# Patient Record
Sex: Male | Born: 1965 | Race: White | Hispanic: No | Marital: Married | Smoking: Current every day smoker
Health system: Southern US, Community
[De-identification: ages and names within clinical notes are randomized; demographics above are authoritative.]

## PROBLEM LIST (undated history)

## (undated) DIAGNOSIS — R161 Splenomegaly, not elsewhere classified: Secondary | ICD-10-CM

## (undated) DIAGNOSIS — I10 Essential (primary) hypertension: Secondary | ICD-10-CM

## (undated) DIAGNOSIS — M1990 Unspecified osteoarthritis, unspecified site: Secondary | ICD-10-CM

## (undated) DIAGNOSIS — Z87442 Personal history of urinary calculi: Secondary | ICD-10-CM

## (undated) DIAGNOSIS — K760 Fatty (change of) liver, not elsewhere classified: Secondary | ICD-10-CM

## (undated) DIAGNOSIS — R5383 Other fatigue: Secondary | ICD-10-CM

## (undated) DIAGNOSIS — F410 Panic disorder [episodic paroxysmal anxiety] without agoraphobia: Secondary | ICD-10-CM

## (undated) DIAGNOSIS — Z9889 Other specified postprocedural states: Secondary | ICD-10-CM

## (undated) DIAGNOSIS — G479 Sleep disorder, unspecified: Secondary | ICD-10-CM

## (undated) DIAGNOSIS — Z8639 Personal history of other endocrine, nutritional and metabolic disease: Secondary | ICD-10-CM

## (undated) DIAGNOSIS — K219 Gastro-esophageal reflux disease without esophagitis: Secondary | ICD-10-CM

## (undated) DIAGNOSIS — R112 Nausea with vomiting, unspecified: Secondary | ICD-10-CM

## (undated) HISTORY — DX: Essential (primary) hypertension: I10

## (undated) HISTORY — PX: DENTAL SURGERY: SHX609

## (undated) HISTORY — DX: Other fatigue: R53.83

## (undated) HISTORY — PX: KNEE ARTHROSCOPY: SUR90

## (undated) HISTORY — PX: CHOLECYSTECTOMY: SHX55

---

## 2007-08-13 ENCOUNTER — Encounter: Payer: Self-pay | Admitting: Cardiology

## 2008-07-07 ENCOUNTER — Encounter: Payer: Self-pay | Admitting: Cardiology

## 2008-09-07 ENCOUNTER — Encounter: Payer: Self-pay | Admitting: Cardiology

## 2008-09-07 ENCOUNTER — Ambulatory Visit: Payer: Self-pay | Admitting: Cardiology

## 2008-10-01 ENCOUNTER — Ambulatory Visit: Payer: Self-pay | Admitting: Cardiology

## 2008-11-03 DIAGNOSIS — R5383 Other fatigue: Secondary | ICD-10-CM

## 2008-11-03 DIAGNOSIS — R079 Chest pain, unspecified: Secondary | ICD-10-CM | POA: Insufficient documentation

## 2008-11-03 DIAGNOSIS — I10 Essential (primary) hypertension: Secondary | ICD-10-CM | POA: Insufficient documentation

## 2008-11-03 DIAGNOSIS — R5381 Other malaise: Secondary | ICD-10-CM | POA: Insufficient documentation

## 2009-04-04 HISTORY — PX: LITHOTRIPSY: SUR834

## 2010-08-17 NOTE — Assessment & Plan Note (Signed)
[facility]                          [facility] OFFICE NOTE   [name], [name]                    MRN:          [id number]  DATE:10/01/2008                            DOB:          02/03/1966    PRIMARY CARE PHYSICIAN:  Kirstie Peri, MD   REASON FOR PRESENTATION:  Evaluate the patient with recent  hospitalization with chest pain.   HISTORY OF PRESENT ILLNESS:  The patient was hospitalized in early June  with chest discomfort.  He had significant cardiovascular risk factors  that we ruled out for myocardial infarction.  He was discharged and had  an outpatient stress perfusion study.  He is able to exercise for 11  minutes, almost without chest discomfort.  His ejection fraction was  found to be 54%.  There was some artifact and some perhaps mild fixed  defect in the inferior wall, but overall was felt to be a low-risk scan.  He presents for followup.  Since going home, he has cut back on his  cigarettes, but he is still smoking half a pack a day.  He is active and  doing some exercising.  He has had some fleeting chest discomfort, but  nothing similar to that which prompted his hospitalization.  He has had  no classic substernal chest discomfort, neck, or arm discomfort.  He  does not have any palpitation, presyncope, or syncope.  He has had no  PND or orthopnea.  He feels very tired.  On further questioning, he says  he only sleeps a few hours at night.   Of note, the patient has been exercising and dieting prior to his  hospitalization and has lost 20 pounds!   PAST MEDICAL HISTORY:  Tobacco abuse, hypertension, cholecystectomy.   ALLERGIES:  Intolerance to CODEINE.   MEDICATIONS:  1. Norvasc 5 mg daily.  2. Atenolol HCT 50/25 daily.  3. Aspirin 81 mg daily.  4. Fish oil.   REVIEW OF SYSTEMS:  As stated in the HPI and otherwise negative for all  other systems.   PHYSICAL EXAMINATION:  GENERAL:  The patient is in no distress.  VITAL SIGNS:  Blood pressure 163/100, heart rate 54 and regular, weight  251 pounds.  HEENT:  Eyes are unremarkable, pupils equal, round and reactive to  light, fundi not visualized, oral mucosa unremarkable.  NECK:  No jugular venous distention at 45 degrees.  Carotid upstroke  brisk and symmetric.  No bruits, no thyromegaly.  LYMPHATICS:  No adenopathy.  LUNGS:  Clear to auscultation bilaterally.  BACK:  No costovertebral angle tenderness.  CHEST:  Unremarkable.  HEART:  PMI not displaced or sustained, S1 and S2 within normal limits,  no S3, no S4, no clicks, no rubs, no murmurs.  ABDOMEN:  Obese, positive bowel sounds.  Normal in frequency and pitch,  no bruits, no rebound, no guarding, no midline pulsatile mass, no  hepatosplenomegaly, no splenomegaly.  SKIN:  No rashes, no nodules.  EXTREMITIES:  Pulses are 2+, no edema.   ASSESSMENT AND PLAN:  1. Chest discomfort.  The patient's chest comfort was atypical and  there was no objective evidence of ischemia on the perfusion study.      There was a questionable defect in the inferior wall, but it was      very low-risk scan.  He and I discussed this result.  He did have      an echocardiogram demonstrating no wall motion abnormalities.      Given this, I do not think further cardiovascular testing is      suggested.  Certainly if he has any recurrent chest discomfort,      then we could consider cardiac catheterization.  Until then he      needs aggressive risk reduction.  2. Tobacco.  We discussed the need to stop smoking completely and      hopefully he will comply with this.  3. Fatigue and insomnia.  I did take the liberty of giving him Ambien      10 mg daily, but only a 26-month prescription.  I told him that if      he needs any renewals on this, he would need to get it from his      primary physician.  4. Hypertension.  His blood pressure is slightly elevated today.  He      has been started on the Norvasc and atenolol  for this.  This should      be followed by his primary care physician.  He needs continued      weight loss and salt restriction.  He will need his meds titrated      if this persists.  He understands the importance of this.  5. Followup.  The patient can be seen back in this clinic as needed,      try to get his blood pressure.      Rollene Rotunda, MD, [facility]  Electronically Signed    JH/MedQ  DD: 10/01/2008  DT: 10/02/2008  Job #: [id number]   cc:   Kirstie Peri, MD

## 2011-11-02 ENCOUNTER — Ambulatory Visit (HOSPITAL_COMMUNITY)
Admission: RE | Admit: 2011-11-02 | Discharge: 2011-11-02 | Disposition: A | Discharge status: Home / Self Care | Payer: Medicaid Other | Source: Ambulatory Visit | Attending: Orthopedic Surgery | Admitting: Orthopedic Surgery

## 2011-11-02 ENCOUNTER — Encounter (HOSPITAL_COMMUNITY)
Admission: RE | Admit: 2011-11-02 | Discharge: 2011-11-02 | Disposition: A | Discharge status: Home / Self Care | Payer: Medicaid Other | Source: Ambulatory Visit | Attending: Orthopedic Surgery | Admitting: Orthopedic Surgery

## 2011-11-02 ENCOUNTER — Encounter (HOSPITAL_COMMUNITY): Payer: Self-pay | Admitting: Pharmacy Technician

## 2011-11-02 ENCOUNTER — Encounter (HOSPITAL_COMMUNITY): Payer: Self-pay

## 2011-11-02 DIAGNOSIS — I498 Other specified cardiac arrhythmias: Secondary | ICD-10-CM | POA: Insufficient documentation

## 2011-11-02 DIAGNOSIS — Z01818 Encounter for other preprocedural examination: Secondary | ICD-10-CM | POA: Insufficient documentation

## 2011-11-02 DIAGNOSIS — Z01812 Encounter for preprocedural laboratory examination: Secondary | ICD-10-CM | POA: Insufficient documentation

## 2011-11-02 DIAGNOSIS — Z01810 Encounter for preprocedural cardiovascular examination: Secondary | ICD-10-CM | POA: Insufficient documentation

## 2011-11-02 DIAGNOSIS — I459 Conduction disorder, unspecified: Secondary | ICD-10-CM | POA: Insufficient documentation

## 2011-11-02 HISTORY — DX: Personal history of urinary calculi: Z87.442

## 2011-11-02 HISTORY — DX: Nausea with vomiting, unspecified: R11.2

## 2011-11-02 HISTORY — DX: Personal history of other endocrine, nutritional and metabolic disease: Z86.39

## 2011-11-02 HISTORY — DX: Sleep disorder, unspecified: G47.9

## 2011-11-02 HISTORY — DX: Gastro-esophageal reflux disease without esophagitis: K21.9

## 2011-11-02 HISTORY — DX: Panic disorder (episodic paroxysmal anxiety): F41.0

## 2011-11-02 HISTORY — DX: Unspecified osteoarthritis, unspecified site: M19.90

## 2011-11-02 HISTORY — DX: Other specified postprocedural states: Z98.890

## 2011-11-02 LAB — URINALYSIS, ROUTINE W REFLEX MICROSCOPIC
Ketones, ur: NEGATIVE mg/dL
Leukocytes, UA: NEGATIVE
Nitrite: NEGATIVE
Protein, ur: NEGATIVE mg/dL

## 2011-11-02 LAB — BASIC METABOLIC PANEL
CO2: 28 mEq/L (ref 19–32)
Calcium: 9.9 mg/dL (ref 8.4–10.5)
Chloride: 99 mEq/L (ref 96–112)
Creatinine, Ser: 0.82 mg/dL (ref 0.50–1.35)
GFR calc Af Amer: >90 mL/min (ref >90)
Sodium: 139 mEq/L (ref 135–145)

## 2011-11-02 LAB — CBC
HCT: 49.8 % (ref 39.0–52.0)
MCV: 81.5 fL (ref 78.0–100.0)
Platelets: 178 10*3/uL (ref 150–400)
RBC: 6.11 MIL/uL — ABNORMAL HIGH (ref 4.22–5.81)
RDW: 12.5 % (ref 11.5–15.5)
WBC: 7.4 10*3/uL (ref 4.0–10.5)

## 2011-11-02 LAB — SURGICAL PCR SCREEN
MRSA, PCR: NEGATIVE
Staphylococcus aureus: NEGATIVE

## 2011-11-02 LAB — APTT: aPTT: 32 seconds (ref 24–37)

## 2011-11-02 LAB — PROTIME-INR: INR: 0.94 (ref 0.00–1.49)

## 2011-11-02 MED ORDER — CHLORHEXIDINE GLUCONATE 4 % EX LIQD
60.0000 mL | Freq: Once | CUTANEOUS | Status: DC
  Filled 2011-11-02: qty 60

## 2011-11-02 NOTE — Patient Instructions (Addendum)
[address] [facility]  11/02/2011   Your procedure is scheduled on:  11/15/11  7:15 AM  Report to SHORT STAY DEPT  at 5:15 AM.  Call this number if you have problems the morning of surgery: [phone]   Remember:   Do not eat food or drink liquids AFTER MIDNIGHT    Take these medicines the morning of surgery with A SIP OF WATER: XANAX IF NEEDED / AMLODIPINE / LORATADINE / OMEPRAZOLE   Do not wear jewelry, make-up or nail polish.  Do not wear lotions, powders, or perfumes.   Do not shave legs or underarms 48 hrs. before surgery (men may shave face)  Do not bring valuables to the hospital.  Contacts, dentures or bridgework may not be worn into surgery.  Leave suitcase in the car. After surgery it may be brought to your room.  For patients admitted to the hospital, checkout time is 11:00 AM the day of discharge.   Patients discharged the day of surgery will not be allowed to drive home.    Special Instructions:   Please read over the following fact sheets that you were given: MRSA  Information / Incentive Spirometer               SHOWER WITH BETASEPT THE NIGHT BEFORE SURGERY AND THE MORNING OF SURGERY

## 2011-11-02 NOTE — Progress Notes (Signed)
H&P dictated 11/02/11 Dictation # [id number]

## 2011-11-03 NOTE — H&P (Signed)
[name], [name]               ACCOUNT NO.:  [id number]  MEDICAL RECORD NO.:  [id number]  LOCATION:  [facility]                         FACILITY:  [facility]  PHYSICIAN:  Jaquelyn Bitter. [employer]heon[employer] Wisby, P.A.DATE OF BIRTH:  Aug 05, 1965  DATE OF ADMISSION:  11/02/2011 DATE OF DISCHARGE:  11/02/2011                             HISTORY & PHYSICAL   DATE OF SURGERY:  November 15, 2011.  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  PROPOSED PROCEDURE:  Unicompartmental arthroplasty, medial compartment, left knee.  HISTORY OF PRESENT ILLNESS:  This is a 46 year old gentleman with history of previous arthroscopy, subtotal meniscectomy, who has developed medial compartment arthritis.  There has been resistant to conservative management.  After discussion of treatment, benefits, risks, and options, the patient is now scheduled for unicompartmental arthroplasty, medial compartment left knee.  Note that the patient is a candidate for trans-cinnamic acid and dexamethasone, will receive both at surgery.  His medical doctor is Dr. Clelia Croft.  He does plan on going home after surgery and is given his home medicines of aspirin, Robaxin, iron, MiraLax, and Colace.  The surgery of risks, benefits, and aftercare were discussed in detail with the patient.  Questions invited and answered.  PAST MEDICAL HISTORY:  Drug allergies to anything with CODEINE or CODEINE DERIVATIVES.  He is used Dilaudid before postoperatively with the good result and then switching to tramadol in the early postoperative period.  CURRENT MEDICATIONS:  Omeprazole 40 mg daily; amlodipine 5 mg daily; atenolol/chlorthalidone 50/25 mg one daily; lisinopril 20 mg daily; Ambien 10 mg daily; alprazolam 0.5 mg daily; and potassium, which he has just recently started on, he does not have the dosage.  SERIOUS MEDICAL ILLNESSES:  Include reflux, anxiety, hypertension.  Also history of kidney stones.  PREVIOUS SURGERIES:  Include  knee arthroscopy by Dr. Cleophas Dunker and by Dr. Charlann Boxer, cholecystectomy and lithotripsy for kidney stones.  FAMILY HISTORY:  Positive for coronary artery disease, adenocarcinoma, and MI.  SOCIAL HISTORY:  The patient is married.  He works as a Financial risk analyst.  He smokes one pack per day and does not drink.  Again, he is planning on going home after surgery.  REVIEW OF SYSTEMS:  CENTRAL NERVOUS SYSTEM:  Positive for anxiety and depression.  PULMONARY:  Negative for shortness of breath, PND, orthopnea.  CARDIOVASCULAR:  Negative chest pain, palpitation.  GI: Positive for heartburn and reflux.  GU:  Negative for urinary tract difficulty.  MUSCULOSKELETAL:  Positive as in HPI.  PHYSICAL EXAMINATION:  VITAL SIGNS:  BP 148/98, pulse 72 and regular, respirations 14. HEENT:  Head normocephalic.  Nose patent.  Ears patent.  Pupils equal, round, reactive to light.  Throat without injection. NECK:  Supple without adenopathy.  Carotids 2+ without bruit. CHEST:  Clear to auscultation.  No rales or rhonchi.  Respirations 14. HEART:  Regular rate and rhythm at 72 beats per minute without murmur. ABDOMEN:  Soft.  Active bowel sounds.  No masses or organomegaly. NEUROLOGIC:  The patient alert and oriented to time, place, and person. Cranial nerves II through XII grossly intact. EXTREMITIES:  Shows the left knee with tenderness in the medial joint line.  Slight varus deformity, full extension, further  flexion to 125 degrees.  Sensation and circulation are intact.  IMPRESSION:  Medial compartment osteoarthritis, left knee.  PLAN:  Unicompartmental arthroplasty, left knee.     Jaquelyn Bitter. Ernestene Kiel.     SJC/MEDQ  D:  11/02/2011  T:  11/03/2011  Job:  [id number]

## 2011-11-04 NOTE — Pre-Procedure Instructions (Signed)
Spoke with [employer] PA concerning Potassium level of 2.9 - he will take care of it

## 2011-11-15 ENCOUNTER — Ambulatory Visit (HOSPITAL_COMMUNITY): Payer: Worker's Compensation | Admitting: Anesthesiology

## 2011-11-15 ENCOUNTER — Encounter (HOSPITAL_COMMUNITY): Payer: Self-pay

## 2011-11-15 ENCOUNTER — Encounter (HOSPITAL_COMMUNITY)
Admission: RE | Disposition: A | Discharge status: Home Health | Payer: Self-pay | Source: Ambulatory Visit | Attending: Orthopedic Surgery

## 2011-11-15 ENCOUNTER — Inpatient Hospital Stay (HOSPITAL_COMMUNITY)
Admission: RE | Admit: 2011-11-15 | Discharge: 2011-11-16 | DRG: 470 | Disposition: A | Discharge status: Home Health | Payer: Worker's Compensation | Source: Ambulatory Visit | Attending: Orthopedic Surgery | Admitting: Orthopedic Surgery

## 2011-11-15 ENCOUNTER — Encounter (HOSPITAL_COMMUNITY): Payer: Self-pay | Admitting: Anesthesiology

## 2011-11-15 DIAGNOSIS — E669 Obesity, unspecified: Secondary | ICD-10-CM | POA: Diagnosis present

## 2011-11-15 DIAGNOSIS — F411 Generalized anxiety disorder: Secondary | ICD-10-CM | POA: Diagnosis present

## 2011-11-15 DIAGNOSIS — K219 Gastro-esophageal reflux disease without esophagitis: Secondary | ICD-10-CM | POA: Diagnosis present

## 2011-11-15 DIAGNOSIS — Z96652 Presence of left artificial knee joint: Secondary | ICD-10-CM

## 2011-11-15 DIAGNOSIS — M1710 Unilateral primary osteoarthritis, unspecified knee: Principal | ICD-10-CM | POA: Diagnosis present

## 2011-11-15 DIAGNOSIS — I10 Essential (primary) hypertension: Secondary | ICD-10-CM | POA: Diagnosis present

## 2011-11-15 HISTORY — PX: PARTIAL KNEE ARTHROPLASTY: SHX2174

## 2011-11-15 LAB — TYPE AND SCREEN: ABO/RH(D): O NEG

## 2011-11-15 SURGERY — ARTHROPLASTY, KNEE, UNICOMPARTMENTAL
Anesthesia: Spinal | Site: Knee | Laterality: Left | Wound class: Clean

## 2011-11-15 MED ORDER — METHOCARBAMOL 100 MG/ML IJ SOLN
500.0000 mg | Freq: Four times a day (QID) | INTRAVENOUS | Status: DC | PRN
  Filled 2011-11-15: qty 5

## 2011-11-15 MED ORDER — AMLODIPINE BESYLATE 5 MG PO TABS
5.0000 mg | ORAL_TABLET | Freq: Every morning | ORAL | Status: DC
  Administered 2011-11-16: 5 mg via ORAL
  Filled 2011-11-15: qty 1

## 2011-11-15 MED ORDER — MENTHOL 3 MG MT LOZG
1.0000 | LOZENGE | OROMUCOSAL | Status: DC | PRN
  Filled 2011-11-15: qty 9

## 2011-11-15 MED ORDER — FENTANYL CITRATE 0.05 MG/ML IJ SOLN
INTRAMUSCULAR | Status: DC | PRN
  Administered 2011-11-15: 100 ug via INTRAVENOUS

## 2011-11-15 MED ORDER — HYDROMORPHONE HCL PF 1 MG/ML IJ SOLN: 0.2500 mg | INTRAMUSCULAR | Status: DC | PRN

## 2011-11-15 MED ORDER — ALPRAZOLAM 0.5 MG PO TABS: 0.5000 mg | ORAL_TABLET | Freq: Three times a day (TID) | ORAL | Status: DC | PRN

## 2011-11-15 MED ORDER — LORATADINE 10 MG PO TABS
10.0000 mg | ORAL_TABLET | Freq: Every day | ORAL | Status: DC | PRN
  Filled 2011-11-15: qty 1

## 2011-11-15 MED ORDER — ZOLPIDEM TARTRATE 5 MG PO TABS: 5.0000 mg | ORAL_TABLET | Freq: Every evening | ORAL | Status: DC | PRN

## 2011-11-15 MED ORDER — DOCUSATE SODIUM 100 MG PO CAPS
100.0000 mg | ORAL_CAPSULE | Freq: Two times a day (BID) | ORAL | Status: DC
  Administered 2011-11-15 – 2011-11-16 (×2): 100 mg via ORAL

## 2011-11-15 MED ORDER — ONDANSETRON HCL 4 MG/2ML IJ SOLN
4.0000 mg | Freq: Four times a day (QID) | INTRAMUSCULAR | Status: DC | PRN
  Administered 2011-11-16: 4 mg via INTRAVENOUS
  Filled 2011-11-15: qty 2

## 2011-11-15 MED ORDER — HYDROMORPHONE HCL 2 MG PO TABS
2.0000 mg | ORAL_TABLET | ORAL | Status: DC | PRN
  Administered 2011-11-15 (×4): 2 mg via ORAL
  Administered 2011-11-15 – 2011-11-16 (×3): 4 mg via ORAL
  Administered 2011-11-16: 2 mg via ORAL
  Filled 2011-11-15: qty 1
  Filled 2011-11-15: qty 2
  Filled 2011-11-15 (×2): qty 1
  Filled 2011-11-15 (×2): qty 2
  Filled 2011-11-15: qty 1
  Filled 2011-11-15: qty 2

## 2011-11-15 MED ORDER — LACTATED RINGERS IV SOLN
INTRAVENOUS | Status: DC | PRN
  Administered 2011-11-15 (×3): via INTRAVENOUS

## 2011-11-15 MED ORDER — KETOROLAC TROMETHAMINE 30 MG/ML IJ SOLN
INTRAMUSCULAR | Status: AC
  Filled 2011-11-15: qty 1

## 2011-11-15 MED ORDER — ACETAMINOPHEN 10 MG/ML IV SOLN
1000.0000 mg | Freq: Four times a day (QID) | INTRAVENOUS | Status: DC
  Administered 2011-11-15 – 2011-11-16 (×3): 1000 mg via INTRAVENOUS
  Filled 2011-11-15 (×5): qty 100

## 2011-11-15 MED ORDER — MEPERIDINE HCL 50 MG/ML IJ SOLN: 6.2500 mg | INTRAMUSCULAR | Status: DC | PRN

## 2011-11-15 MED ORDER — ACETAMINOPHEN 10 MG/ML IV SOLN: 1000.0000 mg | Freq: Once | INTRAVENOUS | Status: DC | PRN

## 2011-11-15 MED ORDER — BUPIVACAINE IN DEXTROSE 0.75-8.25 % IT SOLN
INTRATHECAL | Status: DC | PRN
  Administered 2011-11-15: 15 mg via INTRATHECAL

## 2011-11-15 MED ORDER — SENNA 8.6 MG PO TABS
1.0000 | ORAL_TABLET | Freq: Two times a day (BID) | ORAL | Status: DC
Start: 2011-11-15 — End: 2011-11-16
  Administered 2011-11-15 – 2011-11-16 (×2): 8.6 mg via ORAL
  Filled 2011-11-15 (×2): qty 1

## 2011-11-15 MED ORDER — BUPIVACAINE-EPINEPHRINE PF 0.25-1:200000 % IJ SOLN
INTRAMUSCULAR | Status: AC
  Filled 2011-11-15: qty 30

## 2011-11-15 MED ORDER — PHENOL 1.4 % MT LIQD
1.0000 | OROMUCOSAL | Status: DC | PRN
  Filled 2011-11-15: qty 177

## 2011-11-15 MED ORDER — PROPOFOL 10 MG/ML IV EMUL
INTRAVENOUS | Status: DC | PRN
  Administered 2011-11-15: 80 ug/kg/min via INTRAVENOUS

## 2011-11-15 MED ORDER — CEFAZOLIN SODIUM-DEXTROSE 2-3 GM-% IV SOLR
INTRAVENOUS | Status: AC
  Filled 2011-11-15: qty 50

## 2011-11-15 MED ORDER — ALUM & MAG HYDROXIDE-SIMETH 200-200-20 MG/5ML PO SUSP: 30.0000 mL | ORAL | Status: DC | PRN

## 2011-11-15 MED ORDER — ATENOLOL-CHLORTHALIDONE 50-25 MG PO TABS: 1.0000 | ORAL_TABLET | Freq: Every morning | ORAL | Status: DC

## 2011-11-15 MED ORDER — PROMETHAZINE HCL 25 MG/ML IJ SOLN: 6.2500 mg | INTRAMUSCULAR | Status: DC | PRN

## 2011-11-15 MED ORDER — LISINOPRIL 20 MG PO TABS
20.0000 mg | ORAL_TABLET | Freq: Every morning | ORAL | Status: DC
  Administered 2011-11-15 – 2011-11-16 (×2): 20 mg via ORAL
  Filled 2011-11-15 (×2): qty 1

## 2011-11-15 MED ORDER — DEXAMETHASONE SODIUM PHOSPHATE 10 MG/ML IJ SOLN: 10.0000 mg | Freq: Once | INTRAMUSCULAR | Status: DC

## 2011-11-15 MED ORDER — ONDANSETRON HCL 4 MG PO TABS: 4.0000 mg | ORAL_TABLET | Freq: Four times a day (QID) | ORAL | Status: DC | PRN

## 2011-11-15 MED ORDER — FERROUS SULFATE 325 (65 FE) MG PO TABS
325.0000 mg | ORAL_TABLET | Freq: Three times a day (TID) | ORAL | Status: DC
  Administered 2011-11-15 – 2011-11-16 (×2): 325 mg via ORAL
  Filled 2011-11-15 (×5): qty 1

## 2011-11-15 MED ORDER — HYDROMORPHONE HCL PF 1 MG/ML IJ SOLN
0.2500 mg | INTRAMUSCULAR | Status: DC | PRN
  Administered 2011-11-15 (×4): 0.5 mg via INTRAVENOUS

## 2011-11-15 MED ORDER — TRANEXAMIC ACID 100 MG/ML IV SOLN
15.0000 mg/kg | Freq: Once | INTRAVENOUS | Status: AC
  Administered 2011-11-15: 1776 mg via INTRAVENOUS
  Filled 2011-11-15: qty 17.76

## 2011-11-15 MED ORDER — METHOCARBAMOL 500 MG PO TABS
500.0000 mg | ORAL_TABLET | Freq: Four times a day (QID) | ORAL | Status: DC | PRN
  Administered 2011-11-15 – 2011-11-16 (×2): 500 mg via ORAL
  Filled 2011-11-15 (×2): qty 1

## 2011-11-15 MED ORDER — PANTOPRAZOLE SODIUM 40 MG PO TBEC
80.0000 mg | DELAYED_RELEASE_TABLET | Freq: Every day | ORAL | Status: DC
  Filled 2011-11-15: qty 2

## 2011-11-15 MED ORDER — CHLORTHALIDONE 25 MG PO TABS
25.0000 mg | ORAL_TABLET | Freq: Every day | ORAL | Status: DC
  Administered 2011-11-16: 25 mg via ORAL
  Filled 2011-11-15: qty 1

## 2011-11-15 MED ORDER — CEFAZOLIN SODIUM-DEXTROSE 2-3 GM-% IV SOLR
2.0000 g | INTRAVENOUS | Status: AC
  Administered 2011-11-15: 2 g via INTRAVENOUS

## 2011-11-15 MED ORDER — ACETAMINOPHEN 10 MG/ML IV SOLN
INTRAVENOUS | Status: AC
  Filled 2011-11-15: qty 100

## 2011-11-15 MED ORDER — RIVAROXABAN 10 MG PO TABS
10.0000 mg | ORAL_TABLET | Freq: Every day | ORAL | Status: DC
  Administered 2011-11-16: 10 mg via ORAL
  Filled 2011-11-15 (×2): qty 1

## 2011-11-15 MED ORDER — HYDROMORPHONE HCL PF 1 MG/ML IJ SOLN
INTRAMUSCULAR | Status: AC
  Filled 2011-11-15: qty 1

## 2011-11-15 MED ORDER — ATENOLOL 50 MG PO TABS
50.0000 mg | ORAL_TABLET | Freq: Once | ORAL | Status: AC
  Administered 2011-11-15: 50 mg via ORAL
  Filled 2011-11-15: qty 1

## 2011-11-15 MED ORDER — MIDAZOLAM HCL 5 MG/5ML IJ SOLN
INTRAMUSCULAR | Status: DC | PRN
  Administered 2011-11-15: 2 mg via INTRAVENOUS

## 2011-11-15 MED ORDER — DIPHENHYDRAMINE HCL 12.5 MG/5ML PO ELIX: 25.0000 mg | ORAL_SOLUTION | Freq: Four times a day (QID) | ORAL | Status: DC | PRN

## 2011-11-15 MED ORDER — ACETAMINOPHEN 10 MG/ML IV SOLN
INTRAVENOUS | Status: DC | PRN
  Administered 2011-11-15: 1000 mg via INTRAVENOUS

## 2011-11-15 MED ORDER — 0.9 % SODIUM CHLORIDE (POUR BTL) OPTIME
TOPICAL | Status: DC | PRN
  Administered 2011-11-15: 1000 mL

## 2011-11-15 MED ORDER — KETOROLAC TROMETHAMINE 30 MG/ML IJ SOLN
INTRAMUSCULAR | Status: DC | PRN
  Administered 2011-11-15: 30 mg via INTRAVENOUS

## 2011-11-15 MED ORDER — SODIUM CHLORIDE 0.9 % IV SOLN
INTRAVENOUS | Status: DC
  Administered 2011-11-15: 19:00:00 via INTRAVENOUS
  Filled 2011-11-15 (×3): qty 1000

## 2011-11-15 MED ORDER — ATENOLOL 50 MG PO TABS
50.0000 mg | ORAL_TABLET | Freq: Every day | ORAL | Status: DC
  Administered 2011-11-16: 50 mg via ORAL
  Filled 2011-11-15: qty 1

## 2011-11-15 MED ORDER — BUPIVACAINE-EPINEPHRINE 0.25% -1:200000 IJ SOLN
INTRAMUSCULAR | Status: DC | PRN
  Administered 2011-11-15: 30 mL

## 2011-11-15 MED ORDER — POLYETHYLENE GLYCOL 3350 17 G PO PACK: 17.0000 g | PACK | Freq: Every day | ORAL | Status: DC | PRN

## 2011-11-15 MED ORDER — CEFAZOLIN SODIUM-DEXTROSE 2-3 GM-% IV SOLR
2.0000 g | Freq: Four times a day (QID) | INTRAVENOUS | Status: AC
  Administered 2011-11-15 (×2): 2 g via INTRAVENOUS
  Filled 2011-11-15 (×2): qty 50

## 2011-11-15 MED ORDER — POTASSIUM CHLORIDE CRYS ER 20 MEQ PO TBCR
20.0000 meq | EXTENDED_RELEASE_TABLET | Freq: Every morning | ORAL | Status: DC
  Administered 2011-11-15 – 2011-11-16 (×2): 20 meq via ORAL
  Filled 2011-11-15 (×2): qty 1

## 2011-11-15 SURGICAL SUPPLY — 49 items
BAG ZIPLOCK 12X15 (MISCELLANEOUS) ×2 IMPLANT
BANDAGE ELASTIC 6 VELCRO ST LF (GAUZE/BANDAGES/DRESSINGS) ×2 IMPLANT
BANDAGE ESMARK 6X9 LF (GAUZE/BANDAGES/DRESSINGS) ×1 IMPLANT
BLADE SAW RECIPROCATING 77.5 (BLADE) ×2 IMPLANT
BLADE SAW SGTL 13.0X1.19X90.0M (BLADE) ×2 IMPLANT
BNDG ESMARK 6X9 LF (GAUZE/BANDAGES/DRESSINGS) ×2
BOWL SMART MIX CTS (DISPOSABLE) ×2 IMPLANT
CEMENT HV SMART SET (Cement) ×2 IMPLANT
CLOTH BEACON ORANGE TIMEOUT ST (SAFETY) ×2 IMPLANT
COVER SURGICAL LIGHT HANDLE (MISCELLANEOUS) ×2 IMPLANT
CUFF TOURN SGL QUICK 34 (TOURNIQUET CUFF) ×1
CUFF TRNQT CYL 34X4X40X1 (TOURNIQUET CUFF) ×1 IMPLANT
DERMABOND ADVANCED (GAUZE/BANDAGES/DRESSINGS) ×1
DERMABOND ADVANCED .7 DNX12 (GAUZE/BANDAGES/DRESSINGS) ×1 IMPLANT
DRAPE EXTREMITY T 121X128X90 (DRAPE) ×2 IMPLANT
DRAPE POUCH INSTRU U-SHP 10X18 (DRAPES) ×2 IMPLANT
DRSG AQUACEL AG ADV 3.5X 6 (GAUZE/BANDAGES/DRESSINGS) ×2 IMPLANT
DRSG TEGADERM 4X4.75 (GAUZE/BANDAGES/DRESSINGS) ×2 IMPLANT
DURAPREP 26ML APPLICATOR (WOUND CARE) ×2 IMPLANT
ELECT REM PT RETURN 9FT ADLT (ELECTROSURGICAL) ×2
ELECTRODE REM PT RTRN 9FT ADLT (ELECTROSURGICAL) ×1 IMPLANT
EVACUATOR 1/8 PVC DRAIN (DRAIN) ×2 IMPLANT
FACESHIELD LNG OPTICON STERILE (SAFETY) ×8 IMPLANT
GAUZE SPONGE 2X2 8PLY STRL LF (GAUZE/BANDAGES/DRESSINGS) ×1 IMPLANT
GLOVE BIOGEL PI IND STRL 7.5 (GLOVE) ×1 IMPLANT
GLOVE BIOGEL PI IND STRL 8 (GLOVE) IMPLANT
GLOVE BIOGEL PI INDICATOR 7.5 (GLOVE) ×1
GLOVE BIOGEL PI INDICATOR 8 (GLOVE)
GLOVE ORTHO TXT STRL SZ7.5 (GLOVE) ×4 IMPLANT
GOWN BRE IMP PREV XXLGXLNG (GOWN DISPOSABLE) ×4 IMPLANT
GOWN STRL NON-REIN LRG LVL3 (GOWN DISPOSABLE) ×2 IMPLANT
KIT BASIN OR (CUSTOM PROCEDURE TRAY) ×2 IMPLANT
LEGGING LITHOTOMY PAIR STRL (DRAPES) ×2 IMPLANT
MANIFOLD NEPTUNE II (INSTRUMENTS) ×2 IMPLANT
NDL SAFETY ECLIPSE 18X1.5 (NEEDLE) ×1 IMPLANT
NEEDLE HYPO 18GX1.5 SHARP (NEEDLE) ×1
PACK TOTAL JOINT (CUSTOM PROCEDURE TRAY) ×2 IMPLANT
POSITIONER SURGICAL ARM (MISCELLANEOUS) ×2 IMPLANT
SPONGE GAUZE 2X2 STER 10/PKG (GAUZE/BANDAGES/DRESSINGS) ×1
SUCTION FRAZIER TIP 10 FR DISP (SUCTIONS) ×2 IMPLANT
SUT MNCRL AB 4-0 PS2 18 (SUTURE) ×2 IMPLANT
SUT VIC AB 1 CT1 36 (SUTURE) ×2 IMPLANT
SUT VIC AB 2-0 CT1 27 (SUTURE) ×2
SUT VIC AB 2-0 CT1 TAPERPNT 27 (SUTURE) ×2 IMPLANT
SUT VLOC 180 0 24IN GS25 (SUTURE) ×2 IMPLANT
SYR 50ML LL SCALE MARK (SYRINGE) ×2 IMPLANT
TOWEL OR 17X26 10 PK STRL BLUE (TOWEL DISPOSABLE) ×4 IMPLANT
TRAY FOLEY CATH 14FRSI W/METER (CATHETERS) ×2 IMPLANT
meniscal bearing ×2 IMPLANT

## 2011-11-15 NOTE — Transfer of Care (Signed)
Immediate Anesthesia Transfer of Care Note  Patient: [name]  Procedure(s) Performed: Procedure(s) (LRB): UNICOMPARTMENTAL KNEE (Left)  Patient Location: PACU  Anesthesia Type: Regional  Level of Consciousness: awake, alert  and oriented  Airway & Oxygen Therapy: Patient Spontanous Breathing and Patient connected to face mask oxygen  Post-op Assessment: Report given to PACU RN and Post -op Vital signs reviewed and stable  Post vital signs: Reviewed and stable  Complications: No apparent anesthesia complications

## 2011-11-15 NOTE — Progress Notes (Signed)
[name] called patient and he "took my klorcon as told too"

## 2011-11-15 NOTE — Anesthesia Preprocedure Evaluation (Signed)
Anesthesia Evaluation  Patient identified by MRN, date of birth, ID band Patient awake    Reviewed: Allergy & Precautions, H&P , NPO status , Patient's Chart, lab work & pertinent test results, reviewed documented beta blocker date and time   History of Anesthesia Complications (+) PONV  Airway Mallampati: II TM Distance: >3 FB Neck ROM: Full    Dental  (+) Dental Advisory Given and Teeth Intact   Pulmonary  breath sounds clear to auscultation  Pulmonary exam normal       Cardiovascular Exercise Tolerance: Good hypertension, Pt. on medications and Pt. on home beta blockers Rhythm:Regular Rate:Normal     Neuro/Psych    GI/Hepatic Neg liver ROS, GERD-  Medicated,  Endo/Other  negative endocrine ROS  Renal/GU negative Renal ROS     Musculoskeletal  (+) Arthritis -, Osteoarthritis,    Abdominal (+) + obese,   Peds  Hematology   Anesthesia Other Findings   Reproductive/Obstetrics                           Anesthesia Physical Anesthesia Plan  ASA: II  Anesthesia Plan: Spinal   Post-op Pain Management:    Induction: Intravenous  Airway Management Planned:   Additional Equipment:   Intra-op Plan:   Post-operative Plan:   Informed Consent: I have reviewed the patients History and Physical, chart, labs and discussed the procedure including the risks, benefits and alternatives for the proposed anesthesia with the patient or authorized representative who has indicated his/her understanding and acceptance.   Dental advisory given  Plan Discussed with: CRNA and Surgeon  Anesthesia Plan Comments:         Anesthesia Quick Evaluation

## 2011-11-15 NOTE — Anesthesia Postprocedure Evaluation (Signed)
Anesthesia Post Note  Patient: [name]  Procedure(s) Performed: Procedure(s) (LRB): UNICOMPARTMENTAL KNEE (Left)  Anesthesia type: Spinal  Patient location: PACU  Post pain: Pain level controlled  Post assessment: Post-op Vital signs reviewed  Last Vitals: BP 118/78  Pulse 56  Temp 36.3 C (Oral)  Resp 16  Ht 6\' 1"  (1.854 m)  Wt 261 lb (118.389 kg)  BMI 34.43 kg/m2  SpO2 97%  Post vital signs: Reviewed  Level of consciousness: sedated  Complications: No apparent anesthesia complications

## 2011-11-15 NOTE — Evaluation (Signed)
Physical Therapy Evaluation Patient Details Name: [name] MRN: [id number] DOB: 1965-05-01 Today's Date: 11/15/2011 Time: 4098-1191 PT Time Calculation (min): 37 min  PT Assessment / Plan / Recommendation Clinical Impression  46 yo male s/p L unicompartmental knee arthroplasty. POD 0 on PT eval. Mobilizing well. Reports min-mod pain with activity. Possible d/c home tomorrow per pt/wife. Will assess ambulation/stairs with crutches on tomorrow. Recommend HHPT.    PT Assessment  Patient needs continued PT services    Follow Up Recommendations  Home health PT    Barriers to Discharge        Equipment Recommendations   (To be determined-will assess for crutches)    Recommendations for Other Services OT consult   Frequency 7X/week    Precautions / Restrictions Precautions Precautions: Knee Required Braces or Orthoses: Knee Immobilizer - Left Knee Immobilizer - Left: On when out of bed or walking Restrictions Weight Bearing Restrictions: No LLE Weight Bearing: Weight bearing as tolerated   Pertinent Vitals/Pain 6/10 L knee with activity      Mobility  Bed Mobility Bed Mobility: Supine to Sit Supine to Sit: 4: Min guard;HOB elevated Transfers Transfers: Sit to Stand;Stand to Sit Sit to Stand: 4: Min guard;With upper extremity assist;From bed;From elevated surface Stand to Sit: 4: Min guard;With upper extremity assist;With armrests;To chair/3-in-1 Details for Transfer Assistance: VCs safety, technique, hand placement. Assist to rise, stabilize, control descent.  Ambulation/Gait Ambulation/Gait Assistance: 4: Min guard Ambulation Distance (Feet): 120 Feet Assistive device: Rolling walker Ambulation/Gait Assistance Details: VCs safety, technique, sequence, posture.  Gait Pattern: Step-to pattern;Antalgic;Decreased stride length    Exercises Total Joint Exercises Ankle Circles/Pumps: AROM;Both;10 reps;Supine Quad Sets: AROM;Both;10 reps;Supine Straight Leg Raises:  AROM;Left;10 reps;Supine Long Arc Quad: AROM;Left;10 reps;Supine Knee Flexion: AAROM;Left;Seated (7 reps) Goniometric ROM: 5-95 degrees   PT Diagnosis: Difficulty walking;Abnormality of gait;Acute pain  PT Problem List: Decreased strength;Decreased range of motion;Decreased mobility;Pain;Decreased knowledge of precautions;Decreased knowledge of use of DME PT Treatment Interventions: DME instruction;Gait training;Stair training;Functional mobility training;Therapeutic activities;Therapeutic exercise;Patient/family education   PT Goals Acute Rehab PT Goals PT Goal Formulation: With patient Time For Goal Achievement: 11/22/11 Potential to Achieve Goals: Good Pt will go Supine/Side to Sit: with supervision PT Goal: Supine/Side to Sit - Progress: Goal set today Pt will go Sit to Supine/Side: with supervision PT Goal: Sit to Supine/Side - Progress: Goal set today Pt will go Sit to Stand: with supervision PT Goal: Sit to Stand - Progress: Goal set today Pt will Ambulate: >150 feet;with supervision;with least restrictive assistive device PT Goal: Ambulate - Progress: Goal set today Pt will Go Up / Down Stairs: 3-5 stairs;with supervision;with least restrictive assistive device (5 steps) PT Goal: Up/Down Stairs - Progress: Goal set today  Visit Information  Last PT Received On: 11/15/11 Assistance Needed: +1    Subjective Data  Subjective: "This has been hurting for 2 years" Patient Stated Goal: Less pain. Independence   Prior Functioning  Home Living Lives With: Spouse Available Help at Discharge: Family Type of Home: House Home Access: Stairs to enter Secretary/administrator of Steps: 5 Entrance Stairs-Rails: None Home Layout: One level Home Adaptive Equipment: None Prior Function Level of Independence: Independent Able to Take Stairs?: Yes Driving: Yes Communication Communication: No difficulties    Cognition  Overall Cognitive Status: Appears within functional limits for  tasks assessed/performed Arousal/Alertness: Awake/alert Orientation Level: Appears intact for tasks assessed Behavior During Session: [facility] for tasks performed    Extremity/Trunk Assessment Right Lower Extremity Assessment RLE ROM/Strength/Tone: [facility] for  tasks assessed Left Lower Extremity Assessment LLE ROM/Strength/Tone: Deficits LLE ROM/Strength/Tone Deficits: SLR 3+/5, moves ankle well. Good quad set LLE Sensation: WFL - Light Touch Trunk Assessment Trunk Assessment: Normal   Balance    End of Session PT - End of Session Equipment Utilized During Treatment: Gait belt;Left knee immobilizer Activity Tolerance: Patient tolerated treatment well Patient left: in chair;with call bell/phone within reach;with family/visitor present  GP     Rebeca Alert [facility] 11/15/2011, 4:53 PM [phone]

## 2011-11-15 NOTE — Plan of Care (Signed)
Problem: Consults Goal: Diagnosis- Total Joint Replacement Outcome: Progressing Primary Total Knee     

## 2011-11-15 NOTE — H&P (View-Only) (Signed)
NAME:  [name], [name]               ACCOUNT NO.:  [id number]  MEDICAL RECORD NO.:  [id number]  LOCATION:  [facility]                         FACILITY:  [facility]  PHYSICIAN:  Avin Upperman J. Tranice Laduke, P.A.DATE OF BIRTH:  01/07/1966  DATE OF ADMISSION:  11/02/2011 DATE OF DISCHARGE:  11/02/2011                             HISTORY & PHYSICAL   DATE OF SURGERY:  November 15, 2011.  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  PROPOSED PROCEDURE:  Unicompartmental arthroplasty, medial compartment, left knee.  HISTORY OF PRESENT ILLNESS:  This is a 46-year-old gentleman with history of previous arthroscopy, subtotal meniscectomy, who has developed medial compartment arthritis.  There has been resistant to conservative management.  After discussion of treatment, benefits, risks, and options, the patient is now scheduled for unicompartmental arthroplasty, medial compartment left knee.  Note that the patient is a candidate for trans-cinnamic acid and dexamethasone, will receive both at surgery.  His medical doctor is Dr. Shaw.  He does plan on going home after surgery and is given his home medicines of aspirin, Robaxin, iron, MiraLax, and Colace.  The surgery of risks, benefits, and aftercare were discussed in detail with the patient.  Questions invited and answered.  PAST MEDICAL HISTORY:  Drug allergies to anything with CODEINE or CODEINE DERIVATIVES.  He is used Dilaudid before postoperatively with the good result and then switching to tramadol in the early postoperative period.  CURRENT MEDICATIONS:  Omeprazole 40 mg daily; amlodipine 5 mg daily; atenolol/chlorthalidone 50/25 mg one daily; lisinopril 20 mg daily; Ambien 10 mg daily; alprazolam 0.5 mg daily; and potassium, which he has just recently started on, he does not have the dosage.  SERIOUS MEDICAL ILLNESSES:  Include reflux, anxiety, hypertension.  Also history of kidney stones.  PREVIOUS SURGERIES:  Include  knee arthroscopy by Dr. Whitfield and by Dr. Olin, cholecystectomy and lithotripsy for kidney stones.  FAMILY HISTORY:  Positive for coronary artery disease, adenocarcinoma, and MI.  SOCIAL HISTORY:  The patient is married.  He works as a cook.  He smokes one pack per day and does not drink.  Again, he is planning on going home after surgery.  REVIEW OF SYSTEMS:  CENTRAL NERVOUS SYSTEM:  Positive for anxiety and depression.  PULMONARY:  Negative for shortness of breath, PND, orthopnea.  CARDIOVASCULAR:  Negative chest pain, palpitation.  GI: Positive for heartburn and reflux.  GU:  Negative for urinary tract difficulty.  MUSCULOSKELETAL:  Positive as in HPI.  PHYSICAL EXAMINATION:  VITAL SIGNS:  BP 148/98, pulse 72 and regular, respirations 14. HEENT:  Head normocephalic.  Nose patent.  Ears patent.  Pupils equal, round, reactive to light.  Throat without injection. NECK:  Supple without adenopathy.  Carotids 2+ without bruit. CHEST:  Clear to auscultation.  No rales or rhonchi.  Respirations 14. HEART:  Regular rate and rhythm at 72 beats per minute without murmur. ABDOMEN:  Soft.  Active bowel sounds.  No masses or organomegaly. NEUROLOGIC:  The patient alert and oriented to time, place, and person. Cranial nerves II through XII grossly intact. EXTREMITIES:  Shows the left knee with tenderness in the medial joint line.  Slight varus deformity, full extension, further   flexion to 125 degrees.  Sensation and circulation are intact.  IMPRESSION:  Medial compartment osteoarthritis, left knee.  PLAN:  Unicompartmental arthroplasty, left knee.     [name], P.A.     SJC/MEDQ  D:  11/02/2011  T:  11/03/2011  Job:  [id number] 

## 2011-11-15 NOTE — Op Note (Signed)
NAME: [name]    MEDICAL RECORD NO.: [id number]   FACILITY: [name]   DATE OF BIRTH: May 06, 1965  PHYSICIAN: Madlyn Frankel. Charlann Boxer, M.D.    DATE OF PROCEDURE: 11/15/2011    OPERATIVE REPORT   PREOPERATIVE DIAGNOSIS: Left knee medial compartment osteoarthritis.   POSTOPERATIVE DIAGNOSIS: Left knee medial compartment osteoarthritis.  PROCEDURE: Left partial knee replacement utilizing Biomet Oxford knee  component, size medium femur, a left medial size B tibial tray with a size 3 insert.   SURGEON: Madlyn Frankel. Charlann Boxer, M.D.   ASSISTANT: Leilani Able, PAC.  Please note that [name] was present for the entirety of the case,  utilized for preoperative positioning, perioperative retractor  management, general facilitation of the case and primary wound closure.   ANESTHESIA: Spinal.   SPECIMENS: None.   COMPLICATIONS: None.  DRAINS: 1 medium HV   TOURNIQUET TIME: 41 minutes at 250 mmHg.   INDICATIONS FOR PROCEDURE: The patient is a 46 yo male patient of mine who presented for evaluation of persistent left knee pain.  They presented with primary complaints of pain on the medial side of their knee. Radiographs revealed advanced medial compartment arthritis with specifically an antero-medial wear pattern.  There was bone on bone changes noted with subchondral sclerosis and osteophytes present. The patient has had progressive problems failing to respond to conservative measures of medications, injections and activity modification. Risks of infection, DVT, component failure, need for future revision surgery were all discussed and reviewed.  Consent was obtained for benefit of pain relief.   PROCEDURE IN DETAIL: The patient was brought to the operative theater.  Once adequate anesthesia, preoperative antibiotics, 2 Ancef administered, the patient was positioned in supine position with a left thigh tourniquet  placed. The left lower extremity was prepped and draped in sterile  fashion with  the leg on the Oxford leg holder.  The leg was allowed to flex to 120 degrees. A time-out  was performed identifying the patient, planned procedure, and extremity.  The leg was exsanguinated, tourniquet elevated to 250 mmHg. A midline  incision was made from the proximal pole of the patella to the tibial tubercle. A  soft tissue plane was created and partial median arthrotomy was then  made to allow for subluxation of the patella. Following initial synovectomy and  debridement, the osteophytes were removed off the medial aspect of the  knee.   Attention was first directed to the tibia. The tibial  extramedullary guide was positioned over the anterior crest of the tibia  and pinned into position, and using a measured resection guide from the  Oxford system, a 4 mm resection was made off the proximal tibia. First  the reciprocating saw along the medial aspect of the tibial spines, then the oscillating saw.    At this point, I sized this cut surface seem to be best fit for a size B tibial tray.  With the retractors out of the wound and the knee held at 90 degrees the 3 feeler gauge had appropriate tension on the medial ligament.   At this point, the femoral canal was opened with a drill and the  intramedullary rod passed. Then using the guide for a medium femoral resection off  the posterior aspect of the femur was positioned over the mid portion of the medial femoral condyle.  The orientation was set using the guide that mates the femoral guide to the intramedullary rod.  The 2 drill holes were made into the distal femur.  The  posterior guide was then impacted into place and the posterior  femoral cut made.  At this point, I milled the distal femur with a size 4 spigot in place. At this point, we did a trial reduction of the medium femur, size B tibial tray and a 3 insert. At 90 degrees of  flexion and at 20 degrees of flexion the knee had symmetric tension on  the ligaments.   Given these  findings, the trial femoral component was removed. Final preparation of tibia was carried out by pinning it in position. Then  using a reciprocating saw I removed bone for the keel. Further bone was  removed with an osteotome.  Trial reduction was now carried out with the medium femur, the B tibia, and a size 3 lollipop insert. The balance of the  ligaments appeared to be symmetric at 20 degrees and 90 degrees. Given  all these findings, the trial components were removed.   Cement was mixed. The final components were opened. The knee was irrigated with  normal saline solution. Then final debridements of the  soft tissue was carried out, I also drilled the sclerotic bone with a drill.  The final components were cemented with a single batch of cement in a  two-stage technique with the tibial component cemented first. The knee  was then brought  to 45 degrees of flexion with a 4 feeler gauge, held with pressure for a minute and half.  After this the femoral component was cemented in place.  The knee was again held at 45 degrees of flexion while the cement fully cured.  Excess cement was removed throughout the knee. Tourniquet was let down  after 41 minutes. After the cement had fully cured and excessive cement  was removed throughout the knee there was no visualized cement present.   The final size 3 insert was chosen and snapped into position. We re-irrigated  the knee. I placed a medium Hemovac drain deep. The extensor mechanism  was then reapproximated using a #1 Vicryl with the knee in flexion. The  remaining wound was closed with 2-0 Vicryl and a running 4-0 Monocryl.  The knee was cleaned, dried, and dressed sterilely using Dermabond and  Aquacel dressing. The drain site was dressed separately. The patient  was brought to the recovery room, Ace wrap in place, tolerating the  procedure well. He will be in the [name] for overnight observation.  We will initiate physical therapy and  progress to ambulate.     Madlyn Frankel Charlann Boxer, M.D.

## 2011-11-15 NOTE — Anesthesia Procedure Notes (Signed)
Spinal  Patient location during procedure: OR Start time: 11/15/2011 7:20 AM End time: 11/15/2011 7:21 AM Staffing Anesthesiologist: Gaylan Gerold CRNA/Resident: Carmelia Roller R Performed by: resident/CRNA  Preanesthetic Checklist Completed: patient identified, site marked, surgical consent, pre-op evaluation, timeout performed, IV checked, risks and benefits discussed and monitors and equipment checked Spinal Block Patient position: sitting Prep: Betadine Patient monitoring: heart rate, continuous pulse ox and blood pressure Approach: midline Location: L3-4 Injection technique: single-shot Needle Needle type: Sprotte  Needle gauge: 25 G Needle length: 9 cm Assessment Sensory level: T8 Additional Notes Clear CSF noted 15 mg Bupivacaine injected without difficulty.

## 2011-11-15 NOTE — Interval H&P Note (Signed)
History and Physical Interval Note:  11/15/2011 7:10 AM  [name]  has presented today for surgery, with the diagnosis of Left Knee Medial Compartmental Osteoarthritis  The various methods of treatment have been discussed with the patient and family. After consideration of risks, benefits and other options for treatment, the patient has consented to  Procedure(s) (LRB): LEFT UNICOMPARTMENTAL KNEE (Left) as a surgical intervention .  The patient's history has been reviewed, patient examined, no change in status, stable for surgery.  I have reviewed the patient's chart and labs.  Questions were answered to the patient's satisfaction.     Shelda Pal

## 2011-11-16 ENCOUNTER — Encounter (HOSPITAL_COMMUNITY): Payer: Self-pay | Admitting: Orthopedic Surgery

## 2011-11-16 LAB — BASIC METABOLIC PANEL
Chloride: 103 mEq/L (ref 96–112)
GFR calc Af Amer: >90 mL/min (ref >90)
GFR calc non Af Amer: >90 mL/min (ref >90)
Glucose, Bld: 99 mg/dL (ref 70–99)
Potassium: 3.4 mEq/L — ABNORMAL LOW (ref 3.5–5.1)
Sodium: 141 mEq/L (ref 135–145)

## 2011-11-16 LAB — CBC
Hemoglobin: 16.4 g/dL (ref 13.0–17.0)
MCHC: 35.3 g/dL (ref 30.0–36.0)
WBC: 8.9 10*3/uL (ref 4.0–10.5)

## 2011-11-16 MED ORDER — ASPIRIN EC 325 MG PO TBEC: 325.0000 mg | DELAYED_RELEASE_TABLET | Freq: Two times a day (BID) | ORAL | Status: DC

## 2011-11-16 MED ORDER — HYDROMORPHONE HCL 2 MG PO TABS: 2.0000 mg | ORAL_TABLET | ORAL | Status: AC | PRN

## 2011-11-16 NOTE — Progress Notes (Signed)
Physical Therapy Treatment Patient Details Name: [name] MRN: [id number] DOB: 09-Feb-1966 Today's Date: 11/16/2011 Time: 4098-1191 PT Time Calculation (min): 31 min  PT Assessment / Plan / Recommendation Comments on Treatment Session  Pt progressing well with ambulation, exercises and stair training.  Ready for D//c.     Follow Up Recommendations  Home health PT    Barriers to Discharge        Equipment Recommendations       Recommendations for Other Services OT consult  Frequency 7X/week   Plan Discharge plan remains appropriate    Precautions / Restrictions Precautions Precautions: Knee Required Braces or Orthoses: Knee Immobilizer - Left Knee Immobilizer - Left: On when out of bed or walking Restrictions Weight Bearing Restrictions: No LLE Weight Bearing: Weight bearing as tolerated   Pertinent Vitals/Pain 7/10    Mobility  Bed Mobility Bed Mobility: Supine to Sit Supine to Sit: 4: Min guard;HOB elevated Details for Bed Mobility Assistance: Min/guard for LLE out of bed.  cues for hand placement on bed instead of rails.  Transfers Transfers: Sit to Stand;Stand to Sit Sit to Stand: 5: Supervision;With upper extremity assist;From bed Stand to Sit: 5: Supervision;With upper extremity assist;With armrests;To chair/3-in-1 Details for Transfer Assistance: Cues for safety, technique and hand placement.  Ambulation/Gait Ambulation/Gait Assistance: 4: Min guard Ambulation Distance (Feet): 300 Feet Assistive device: Crutches Ambulation/Gait Assistance Details: cues for sequencing/technique with crutches, increased WB on LLE and upright posture.   Gait Pattern: Step-to pattern;Antalgic;Decreased stride length Gait velocity: decreased Stairs: Yes Stairs Assistance: 4: Min assist Stairs Assistance Details (indicate cue type and reason): Cues for sequencing/technique with crutches.   Stair Management Technique: No rails;Step to pattern;Forwards;With crutches Number of  Stairs: 4     Exercises Total Joint Exercises Ankle Circles/Pumps: AROM;Both;20 reps Quad Sets: AROM;Both;10 reps Heel Slides: AAROM;Left;5 reps Straight Leg Raises: AAROM;Left;5 reps Knee Flexion: AAROM;Left;Seated   PT Diagnosis:    PT Problem List:   PT Treatment Interventions:     PT Goals Acute Rehab PT Goals PT Goal Formulation: With patient Time For Goal Achievement: 11/22/11 Potential to Achieve Goals: Good Pt will go Supine/Side to Sit: with supervision PT Goal: Supine/Side to Sit - Progress: Progressing toward goal Pt will go Sit to Stand: with supervision PT Goal: Sit to Stand - Progress: Met Pt will Ambulate: >150 feet;with supervision;with least restrictive assistive device PT Goal: Ambulate - Progress: Progressing toward goal Pt will Go Up / Down Stairs: 3-5 stairs;with supervision;with least restrictive assistive device PT Goal: Up/Down Stairs - Progress: Progressing toward goal  Visit Information  Last PT Received On: 11/16/11 Assistance Needed: +1    Subjective Data  Subjective: It just hurts pretty bad when I move it.  Patient Stated Goal: Less pain. Independence   Cognition  Overall Cognitive Status: Appears within functional limits for tasks assessed/performed Arousal/Alertness: Awake/alert Orientation Level: Appears intact for tasks assessed Behavior During Session: [facility] for tasks performed    Balance     End of Session PT - End of Session Equipment Utilized During Treatment: Gait belt;Left knee immobilizer Activity Tolerance: Patient limited by pain Patient left: in chair;with call bell/phone within reach Nurse Communication: Patient requests pain meds   GP     Page, Meribeth Mattes 11/16/2011, 9:25 AM

## 2011-11-16 NOTE — Progress Notes (Signed)
CSW consulted for SNF placement. PN notes reviewed. Pt to be d/c home today with [facility]. RNCM will assist with d/c planning needs.  Cori Razor LCSW [phone]

## 2011-11-16 NOTE — Care Management Note (Signed)
    Page 1 of 2   11/16/2011     7:06:42 PM   CARE MANAGEMENT NOTE 11/16/2011  Patient:  [name], [name]   Account Number:  [id number]  Date Initiated:  11/16/2011  Documentation initiated by:  Colleen Can  Subjective/Objective Assessment:   DX MEDIAL COMPARTMENTAL OSTEOAARTHRITIS -LEFT KNEE; LEFT PARTIAL KNEE REPLACEMNT  WORKER COMP ZOXWR#[id number]  DOI-12/03/2009     Action/Plan:   CM SPOKE WITH PATIENT. Plans are for patient to return to his home in [place] where spouse will be caregiver. He is needing RW, 3n1 and HHpt. Contact per in [place] to advise of needed services-   Anticipated DC Date:  11/16/2011   Anticipated DC Plan:  HOME W HOME HEALTH SERVICES  In-house referral  Clinical Social Worker      DC Planning Services  CM consult      PAC Choice  DURABLE MEDICAL EQUIPMENT  HOME HEALTH   Choice offered to / List presented to:  C-1 Patient   DME arranged  3-N-1  WALKER - ROLLING      DME agency  OTHER - SEE NOTE     HH arranged  HH-2 PT      HH agency  OTHER - SEE NOTE   Status of service:  Completed, signed off Medicare Important Message given?  NO (If response is "NO", the following Medicare IM given date fields will be blank) Date Medicare IM given:   Date Additional Medicare IM given:    Discharge Disposition:  HOME W HOME HEALTH SERVICES  Per UR Regulation:  Reviewed for med. necessity/level of care/duration of stay  If discussed at Long Length of Stay Meetings, dates discussed:    Comments:  11/16/2011 Raynelle Bring BSN CCM [phone] TCT contact-Cindy Elsie Ra [phone] Francis Dowse wants orders for hh and DME faxed to [phone]. She advised that she will send request to adjuster-Tana Stroupe-[phone] Received call from adjuster -Stroupe; advised CM to call [facility] who handles DME and [facility] services- toll free [phone] fax-[phone]. [facility]ct [facility]-spoke with Khrista-intake who  requested demographic information and that face sheet, HH and dme orders, h&P be faxed  to [phone]. info was faxed with confirmation-req #[id number]. Khrista  from Intake states that patient will be contacted regarding delivery of DME and start of [facility] services when set up is completed.

## 2011-11-16 NOTE — Discharge Summary (Signed)
Physician Discharge Summary  Patient ID: [name] MRN: [id number] DOB/AGE: [birth year] 45 y.o.  Admit date: 11/15/2011 Discharge date: 11/16/2011   Procedures:  Procedure(s) (LRB): UNICOMPARTMENTAL KNEE (Left)  Attending Physician:  Dr. Durene Romans   Admission Diagnoses:   Medial compartment osteoarthritis of the left knee.  Discharge Diagnoses:  Principal Problem:  *S/P left UKR Reflux Anxiety HTN History of kidney stones   HPI:  This is a 46 year old gentleman with history of previous arthroscopy, subtotal meniscectomy, who has  developed medial compartment arthritis. There has been resistant to conservative management. After discussion of treatment, benefits, risks, and options, the patient is now scheduled for unicompartmental arthroplasty, medial compartment left knee. Note that the patient is a candidate for trans-cinnamic acid and dexamethasone, will receive both at surgery. His medical doctor is Dr. Clelia Croft. He does plan on going home after surgery and is given his home medicines of aspirin, Robaxin, iron, MiraLax, and Colace. The surgery of risks, benefits, and aftercare were  discussed in detail with the patient. Questions invited and answered.  PCP: No primary provider on file.   Discharged Condition: good  Hospital Course:  Patient underwent the above stated procedure on 11/15/2011. Patient tolerated the procedure well and brought to the recovery room in good condition and subsequently to the floor.  POD #1 BP: 156/92 ; Pulse: 54 ; Temp: 98.4 F (36.9 C) ; Resp: 16  Pt's foley was removed, as well as the hemovac drain removed. IV was changed to a saline lock. Patient reports pain as mild, pain well controlled. Difficulty sleeping, but otherwise no events throughout the night. Ready to be discharged home. Neurovascular intact, dorsiflexion/plantar flexion intact, incision: dressing C/D/I, no cellulitis present and compartment soft.   LABS  Basename  11/16/11  0437  HGB  16.4  HCT  46.5    Discharge Exam: General appearance: alert, cooperative and no distress Extremities: Homans sign is negative, no sign of DVT, no edema, redness or tenderness in the calves or thighs and no ulcers, gangrene or trophic changes  Disposition:  Home or Self Care with follow up in 2 weeks   Follow-up Information    Follow up with Shelda Pal, MD in 2 weeks.   Contact information:   [facility] [address] [place] [address] [phone]          Discharge Orders    Future Orders Please Complete By Expires   Diet - low sodium heart healthy      Call MD / Call 911      Comments:   If you experience chest pain or shortness of breath, CALL 911 and be transported to the hospital emergency room.  If you develope a fever above 101 F, pus (white drainage) or increased drainage or redness at the wound, or calf pain, call your surgeon's office.   Discharge instructions      Comments:   Maintain surgical dressing for 8 days, then replace with gauze and tape. Keep the area dry and clean until follow up. Follow up in 2 weeks at [facility]. Call with any questions or concerns.   Constipation Prevention      Comments:   Drink plenty of fluids.  Prune juice may be helpful.  You may use a stool softener, such as Colace (over the counter) 100 mg twice a day.  Use MiraLax (over the counter) for constipation as needed.   Increase activity slowly as tolerated  Driving restrictions      Comments:   No driving for 4 weeks   TED hose      Comments:   Use stockings (TED hose) for 2 weeks on both leg(s).  You may remove them at night for sleeping.   Change dressing      Comments:   Maintain surgical dressing for 8 days, then change the dressing daily with sterile 4 x 4 inch gauze dressing and tape. Keep the area dry and clean.      Discharge Medication List as of 11/16/2011 10:13 AM    START taking  these medications   Details  HYDROmorphone (DILAUDID) 2 MG tablet Take 1-2 tablets (2-4 mg total) by mouth every 4 (four) hours as needed., Starting 11/16/2011, Until Sat 11/26/11, Print      CONTINUE these medications which have CHANGED   Details  aspirin EC 325 MG tablet Take 1 tablet (325 mg total) by mouth 2 (two) times daily., Starting 11/16/2011, Until Discontinued, No Print      CONTINUE these medications which have NOT CHANGED   Details  ALPRAZolam (XANAX) 0.5 MG tablet Take 0.5 mg by mouth 3 (three) times daily as needed. For anxiety, Until Discontinued, Historical Med    amLODipine (NORVASC) 5 MG tablet Take 5 mg by mouth every morning., Until Discontinued, Historical Med    atenolol-chlorthalidone (TENORETIC) 50-25 MG per tablet Take 1 tablet by mouth every morning., Until Discontinued, Historical Med    lisinopril (PRINIVIL,ZESTRIL) 20 MG tablet Take 20 mg by mouth every morning., Until Discontinued, Historical Med    loratadine (CLARITIN) 10 MG tablet Take 10 mg by mouth daily as needed. For allergies, Until Discontinued, Historical Med    omeprazole (PRILOSEC) 40 MG capsule Take 40 mg by mouth every morning., Until Discontinued, Historical Med    zolpidem (AMBIEN) 10 MG tablet Take 10 mg by mouth at bedtime as needed., Until Discontinued, Historical Med    docusate sodium (COLACE) 100 MG capsule Take 100 mg by mouth 2 (two) times daily., Until Discontinued, Historical Med    ferrous sulfate 325 (65 FE) MG tablet Take 325 mg by mouth 3 (three) times daily. For 2 to 3 weeks after surgery, Until Discontinued, Historical Med    methocarbamol (ROBAXIN) 500 MG tablet Take 500 mg by mouth every 6 (six) hours., Until Discontinued, Historical Med    polyethylene glycol (MIRALAX / GLYCOLAX) packet Take 17 g by mouth every morning., Until Discontinued, Historical Med    potassium chloride SA (K-DUR,KLOR-CON) 20 MEQ tablet Take 20 mEq by mouth every morning. Started 2 days ago. Given  a 14 day supply until patient can see primary doctor., Until Discontinued, Historical Med      STOP taking these medications     aspirin 81 MG chewable tablet Comments:  Reason for Stopping:           Signed: Anastasio Auerbach. Henriette Hesser   PAC  11/16/2011, 11:09 PM

## 2011-11-16 NOTE — Progress Notes (Signed)
   Subjective: 1 Day Post-Op Procedure(s) (LRB): UNICOMPARTMENTAL KNEE (Left)   Patient reports pain as mild, pain well controlled. Difficulty sleeping, but otherwise no events throughout the night. Ready to be discharged home.  Objective:   VITALS:   Filed Vitals:   11/16/11 0458  BP: 156/92  Pulse: 54  Temp: 98.4 F (36.9 C)  Resp: 16    Neurovascular intact Dorsiflexion/Plantar flexion intact Incision: dressing C/D/I No cellulitis present Compartment soft  LABS  Basename 11/16/11 0437  HGB 16.4  HCT 46.5  WBC 8.9  PLT 172     Basename 11/16/11 0437 11/15/11 0600  NA 141 --  K 3.4* 3.2*  BUN 21 --  CREATININE 0.97 --  GLUCOSE 99 --     Assessment/Plan: 1 Day Post-Op Procedure(s) (LRB): UNICOMPARTMENTAL KNEE (Left)   HV drain d/c'ed Foley cath d/c'ed Advance diet Up with therapy D/C IV fluids Discharge home with home health Follow up in 2 weeks at [facility].  Follow-up Information    Follow up with OLIN,Corrina Steffensen D in 2 weeks.   Contact information:   [facility] [address], [address] [place] [address] [phone]          Anastasio Auerbach. Tilda Samudio   PAC  11/16/2011, 7:48 AM

## 2011-11-16 NOTE — Evaluation (Signed)
Occupational Therapy Evaluation Patient Details Name: [name] MRN: [id number] DOB: 24-Nov-1965 Today's Date: 11/16/2011 Time: 4098-1191 OT Time Calculation (min): 19 min  OT Assessment / Plan / Recommendation Clinical Impression  Pt is supposed to discharge today. Doing well.     OT Assessment  Patient needs continued OT Services    Follow Up Recommendations  No OT follow up;Supervision/Assistance - 24 hour    Barriers to Discharge      Equipment Recommendations  3 in 1 bedside comode    Recommendations for Other Services    Frequency  Min 2X/week    Precautions / Restrictions Precautions Precautions: Knee Required Braces or Orthoses: Knee Immobilizer - Left Knee Immobilizer - Left: On when out of bed or walking Restrictions Weight Bearing Restrictions: No LLE Weight Bearing: Weight bearing as tolerated        ADL  Eating/Feeding: Simulated;Independent Where Assessed - Eating/Feeding: Chair Grooming: Simulated;Set up Where Assessed - Grooming: Supported sitting Upper Body Bathing: Simulated;Right arm;Chest;Left arm;Abdomen;Set up Where Assessed - Upper Body Bathing: Unsupported sitting Lower Body Bathing: Simulated;Minimal assistance Where Assessed - Lower Body Bathing: Supported sit to stand Upper Body Dressing: Simulated;Set up Where Assessed - Upper Body Dressing: Unsupported sitting Lower Body Dressing: Minimal assistance Where Assessed - Lower Body Dressing: Supported sit to stand Toilet Transfer: Performed Toilet Transfer Method: Other (comment) (ambulating) Toilet Transfer Equipment: Raised toilet seat with arms (or 3-in-1 over toilet);Other (comment) (crutches) Toileting - Clothing Manipulation and Hygiene: Simulated;Min guard Where Assessed - Toileting Clothing Manipulation and Hygiene: Sit to stand from 3-in-1 or toilet Tub/Shower Transfer Method: Not assessed Equipment Used: Long-handled sponge;Reacher;Long-handled shoe horn;Knee  Immobilizer;Sock aid;Other (comment) (crutches) ADL Comments: Pt appeared alittle distracted during session, looking around and needed cues at times for safety. Pt did state room felt warm so temperature adjusted. Min cues to back all the way  up to surface with crutches before he puts crutches down. Briefly educated on AE as pt's wife has all AE and pt familiar with how to use all AE. he didnt feel he needed to practice with it. Discussed sponge bathing until  he doesnt need KI and able to step into tub safer. Pt verbalized understanding.     OT Diagnosis: Generalized weakness  OT Problem List: Decreased strength;Decreased knowledge of use of DME or AE;Decreased activity tolerance;Pain OT Treatment Interventions: Self-care/ADL training;Therapeutic activities;DME and/or AE instruction;Patient/family education   OT Goals Acute Rehab OT Goals OT Goal Formulation: With patient Time For Goal Achievement: 11/23/11 Potential to Achieve Goals: Good ADL Goals Pt Will Perform Grooming: with supervision;Standing at sink ADL Goal: Grooming - Progress: Goal set today Pt Will Perform Lower Body Bathing: with supervision;Sit to stand from chair;Sit to stand from bed;with adaptive equipment ADL Goal: Lower Body Bathing - Progress: Goal set today Pt Will Perform Lower Body Dressing: with supervision;Sit to stand from bed;Sit to stand from chair;with adaptive equipment ADL Goal: Lower Body Dressing - Progress: Goal set today Pt Will Transfer to Toilet: with supervision;Ambulation;with DME;3-in-1;Other (comment) (crutches) ADL Goal: Toilet Transfer - Progress: Goal set today Pt Will Perform Toileting - Clothing Manipulation: with supervision;Standing ADL Goal: Toileting - Clothing Manipulation - Progress: Goal set today  Visit Information  Last OT Received On: 11/16/11 Assistance Needed: +1    Subjective Data  Subjective: I am ready to get out of this room Patient Stated Goal: home   Prior  Functioning  Vision/Perception  Home Living Lives With: Spouse Available Help at Discharge: Family Type of Home: House  Home Access: Stairs to enter Entrance Stairs-Number of Steps: 5 Entrance Stairs-Rails: None Home Layout: One level Bathroom Shower/Tub: Engineer, manufacturing systems: Standard Home Adaptive Equipment: None;Long-handled sponge;Long-handled shoehorn;Reacher;Sock aid Prior Function Level of Independence: Independent Able to Take Stairs?: Yes Driving: Yes Communication Communication: No difficulties      Cognition  Overall Cognitive Status: Appears within functional limits for tasks assessed/performed Arousal/Alertness: Awake/alert Orientation Level: Appears intact for tasks assessed Behavior During Session: [facility] for tasks performed Cognition - Other Comments: alittle distracted during session at times.    Extremity/Trunk Assessment Right Upper Extremity Assessment RUE ROM/Strength/Tone: [facility] for tasks assessed Left Upper Extremity Assessment LUE ROM/Strength/Tone: WFL for tasks assessed   Mobility Bed Mobility Bed Mobility: Supine to Sit Supine to Sit: 4: Min guard;HOB elevated Details for Bed Mobility Assistance: Min/guard for LLE out of bed.  cues for hand placement on bed instead of rails.  Transfers Transfers: Sit to Stand;Stand to Sit Sit to Stand: 4: Min guard;With upper extremity assist;From chair/3-in-1 Stand to Sit: 4: Min guard;With upper extremity assist;To chair/3-in-1 Details for Transfer Assistance:  verbal cues for safety with hand placement.      Balance    End of Session OT - End of Session Equipment Utilized During Treatment: Left knee immobilizer Activity Tolerance: Patient tolerated treatment well Patient left: in chair;with call bell/phone within reach;with family/visitor present  GO     Lennox Laity [phone] 11/16/2011, 12:01 PM

## 2013-09-03 ENCOUNTER — Encounter (HOSPITAL_COMMUNITY): Payer: Self-pay | Admitting: Pharmacy Technician

## 2013-09-03 ENCOUNTER — Other Ambulatory Visit (HOSPITAL_COMMUNITY): Payer: Self-pay | Admitting: *Deleted

## 2013-09-03 ENCOUNTER — Encounter (HOSPITAL_COMMUNITY): Payer: Self-pay | Admitting: *Deleted

## 2013-09-03 NOTE — Progress Notes (Signed)
[employer] and requested orders.

## 2013-09-03 NOTE — Discharge Instructions (Signed)
°  [name]  DOS 09/04/13 DOB 07/27/1965  PT INSTRUCTED TO ARRIVE AT [place] ON 09/04/13 AT 1:30 PM  INSTRUCTED NO FOOD AFTER MIDNIGHT BUT MAY HAVE CLEAR LIQUIDS FROM 12:00 AM TO 10:30 AM THEN NOTHING  INSTRUCTED TO TAKE ALPRAZOLAM / AMLODIPINE / LORATADINE / OMEPRAZOLE THE MORNING OF SURGERY  INSTRUCTED ON BETASEPT SHOWER PROTOCOL  D Bevely Hackbart RN

## 2013-09-03 NOTE — Progress Notes (Signed)
Need orders please - pt coming for surgery  tomorrow 09/04/13 - thank you

## 2013-09-04 ENCOUNTER — Ambulatory Visit (HOSPITAL_COMMUNITY): Payer: Medicaid Other

## 2013-09-04 ENCOUNTER — Encounter (HOSPITAL_COMMUNITY): Payer: Medicaid Other | Admitting: Anesthesiology

## 2013-09-04 ENCOUNTER — Ambulatory Visit (HOSPITAL_COMMUNITY): Payer: Medicaid Other | Admitting: Anesthesiology

## 2013-09-04 ENCOUNTER — Encounter (HOSPITAL_COMMUNITY): Payer: Self-pay | Admitting: *Deleted

## 2013-09-04 ENCOUNTER — Encounter (HOSPITAL_COMMUNITY)
Admission: RE | Disposition: A | Discharge status: Home Health | Payer: Self-pay | Source: Ambulatory Visit | Attending: Orthopedic Surgery

## 2013-09-04 ENCOUNTER — Inpatient Hospital Stay (HOSPITAL_COMMUNITY)
Admission: RE | Admit: 2013-09-04 | Discharge: 2013-09-05 | DRG: 489 | Disposition: A | Discharge status: Home Health | Payer: Medicaid Other | Source: Ambulatory Visit | Attending: Orthopedic Surgery | Admitting: Orthopedic Surgery

## 2013-09-04 DIAGNOSIS — Z888 Allergy status to other drugs, medicaments and biological substances status: Secondary | ICD-10-CM

## 2013-09-04 DIAGNOSIS — Z7982 Long term (current) use of aspirin: Secondary | ICD-10-CM

## 2013-09-04 DIAGNOSIS — T84029A Dislocation of unspecified internal joint prosthesis, initial encounter: Principal | ICD-10-CM | POA: Diagnosis present

## 2013-09-04 DIAGNOSIS — K219 Gastro-esophageal reflux disease without esophagitis: Secondary | ICD-10-CM | POA: Diagnosis present

## 2013-09-04 DIAGNOSIS — Z87442 Personal history of urinary calculi: Secondary | ICD-10-CM

## 2013-09-04 DIAGNOSIS — E669 Obesity, unspecified: Secondary | ICD-10-CM | POA: Diagnosis present

## 2013-09-04 DIAGNOSIS — I10 Essential (primary) hypertension: Secondary | ICD-10-CM | POA: Diagnosis present

## 2013-09-04 DIAGNOSIS — Y831 Surgical operation with implant of artificial internal device as the cause of abnormal reaction of the patient, or of later complication, without mention of misadventure at the time of the procedure: Secondary | ICD-10-CM | POA: Diagnosis present

## 2013-09-04 DIAGNOSIS — Z96659 Presence of unspecified artificial knee joint: Secondary | ICD-10-CM

## 2013-09-04 DIAGNOSIS — Z96652 Presence of left artificial knee joint: Secondary | ICD-10-CM

## 2013-09-04 DIAGNOSIS — Z9089 Acquired absence of other organs: Secondary | ICD-10-CM

## 2013-09-04 DIAGNOSIS — Z8249 Family history of ischemic heart disease and other diseases of the circulatory system: Secondary | ICD-10-CM

## 2013-09-04 DIAGNOSIS — Z79899 Other long term (current) drug therapy: Secondary | ICD-10-CM

## 2013-09-04 DIAGNOSIS — F410 Panic disorder [episodic paroxysmal anxiety] without agoraphobia: Secondary | ICD-10-CM | POA: Diagnosis present

## 2013-09-04 DIAGNOSIS — F17200 Nicotine dependence, unspecified, uncomplicated: Secondary | ICD-10-CM | POA: Diagnosis present

## 2013-09-04 DIAGNOSIS — Z6833 Body mass index (BMI) 33.0-33.9, adult: Secondary | ICD-10-CM

## 2013-09-04 HISTORY — PX: PARTIAL KNEE ARTHROPLASTY: SHX2174

## 2013-09-04 LAB — URINALYSIS, ROUTINE W REFLEX MICROSCOPIC
BILIRUBIN URINE: NEGATIVE
GLUCOSE, UA: NEGATIVE mg/dL
HGB URINE DIPSTICK: NEGATIVE
Ketones, ur: NEGATIVE mg/dL
Leukocytes, UA: NEGATIVE
Nitrite: NEGATIVE
PROTEIN: NEGATIVE mg/dL
Specific Gravity, Urine: 1.022 (ref 1.005–1.030)
Urobilinogen, UA: 1 mg/dL (ref 0.0–1.0)
pH: 6.5 (ref 5.0–8.0)

## 2013-09-04 LAB — CBC
HCT: 48.1 % (ref 39.0–52.0)
Hemoglobin: 17.2 g/dL — ABNORMAL HIGH (ref 13.0–17.0)
MCH: 29.6 pg (ref 26.0–34.0)
MCHC: 35.8 g/dL (ref 30.0–36.0)
MCV: 82.6 fL (ref 78.0–100.0)
Platelets: 173 10*3/uL (ref 150–400)
RBC: 5.82 MIL/uL — AB (ref 4.22–5.81)
RDW: 12.9 % (ref 11.5–15.5)
WBC: 7.4 10*3/uL (ref 4.0–10.5)

## 2013-09-04 LAB — BASIC METABOLIC PANEL
BUN: 16 mg/dL (ref 6–23)
CALCIUM: 9.7 mg/dL (ref 8.4–10.5)
CO2: 26 meq/L (ref 19–32)
Chloride: 102 mEq/L (ref 96–112)
Creatinine, Ser: 0.85 mg/dL (ref 0.50–1.35)
Glucose, Bld: 95 mg/dL (ref 70–99)
Potassium: 3.6 mEq/L — ABNORMAL LOW (ref 3.7–5.3)
SODIUM: 141 meq/L (ref 137–147)

## 2013-09-04 LAB — PROTIME-INR
INR: 0.99 (ref 0.00–1.49)
PROTHROMBIN TIME: 12.9 s (ref 11.6–15.2)

## 2013-09-04 LAB — TYPE AND SCREEN
ABO/RH(D): O NEG
Antibody Screen: NEGATIVE

## 2013-09-04 LAB — APTT: aPTT: 30 seconds (ref 24–37)

## 2013-09-04 LAB — SURGICAL PCR SCREEN
MRSA, PCR: NEGATIVE
STAPHYLOCOCCUS AUREUS: NEGATIVE

## 2013-09-04 SURGERY — ARTHROPLASTY, KNEE, UNICOMPARTMENTAL
Anesthesia: General | Site: Knee | Laterality: Left

## 2013-09-04 MED ORDER — KETOROLAC TROMETHAMINE 15 MG/ML IJ SOLN
INTRAMUSCULAR | Status: DC | PRN
  Administered 2013-09-04: 15 mg via INTRAMUSCULAR

## 2013-09-04 MED ORDER — PROPOFOL 10 MG/ML IV BOLUS
INTRAVENOUS | Status: DC | PRN
  Administered 2013-09-04: 200 mg via INTRAVENOUS

## 2013-09-04 MED ORDER — ATENOLOL 50 MG PO TABS
50.0000 mg | ORAL_TABLET | Freq: Once | ORAL | Status: AC
  Administered 2013-09-04: 50 mg via ORAL
  Filled 2013-09-04: qty 1

## 2013-09-04 MED ORDER — PROMETHAZINE HCL 25 MG/ML IJ SOLN
INTRAMUSCULAR | Status: AC
  Filled 2013-09-04: qty 1

## 2013-09-04 MED ORDER — SODIUM CHLORIDE 0.9 % IJ SOLN
INTRAMUSCULAR | Status: AC
  Filled 2013-09-04: qty 10

## 2013-09-04 MED ORDER — MUPIROCIN 2 % EX OINT
TOPICAL_OINTMENT | Freq: Two times a day (BID) | CUTANEOUS | Status: DC
  Filled 2013-09-04: qty 22

## 2013-09-04 MED ORDER — FENTANYL CITRATE 0.05 MG/ML IJ SOLN
INTRAMUSCULAR | Status: AC
  Filled 2013-09-04: qty 2

## 2013-09-04 MED ORDER — BUPIVACAINE-EPINEPHRINE (PF) 0.25% -1:200000 IJ SOLN
INTRAMUSCULAR | Status: AC
  Filled 2013-09-04: qty 30

## 2013-09-04 MED ORDER — FENTANYL CITRATE 0.05 MG/ML IJ SOLN
INTRAMUSCULAR | Status: DC | PRN
  Administered 2013-09-04 (×2): 50 ug via INTRAVENOUS
  Administered 2013-09-04: 100 ug via INTRAVENOUS
  Administered 2013-09-04 (×2): 50 ug via INTRAVENOUS

## 2013-09-04 MED ORDER — LIDOCAINE HCL (CARDIAC) 20 MG/ML IV SOLN
INTRAVENOUS | Status: AC
  Filled 2013-09-04: qty 5

## 2013-09-04 MED ORDER — PROPOFOL 10 MG/ML IV BOLUS
INTRAVENOUS | Status: AC
  Filled 2013-09-04: qty 20

## 2013-09-04 MED ORDER — LIDOCAINE HCL (CARDIAC) 20 MG/ML IV SOLN
INTRAVENOUS | Status: DC | PRN
  Administered 2013-09-04: 30 mg via INTRAVENOUS

## 2013-09-04 MED ORDER — ALPRAZOLAM 0.5 MG PO TABS
0.5000 mg | ORAL_TABLET | Freq: Two times a day (BID) | ORAL | Status: DC | PRN
  Administered 2013-09-05: 0.5 mg via ORAL
  Filled 2013-09-04: qty 1

## 2013-09-04 MED ORDER — HYDROMORPHONE HCL PF 1 MG/ML IJ SOLN: 0.2500 mg | INTRAMUSCULAR | Status: DC | PRN

## 2013-09-04 MED ORDER — ONDANSETRON HCL 4 MG/2ML IJ SOLN: 4.0000 mg | Freq: Four times a day (QID) | INTRAMUSCULAR | Status: DC | PRN

## 2013-09-04 MED ORDER — CEFAZOLIN SODIUM-DEXTROSE 2-3 GM-% IV SOLR
INTRAVENOUS | Status: AC
  Filled 2013-09-04: qty 50

## 2013-09-04 MED ORDER — METHOCARBAMOL 1000 MG/10ML IJ SOLN
500.0000 mg | Freq: Four times a day (QID) | INTRAVENOUS | Status: DC | PRN
  Administered 2013-09-04: 500 mg via INTRAVENOUS
  Filled 2013-09-04: qty 5

## 2013-09-04 MED ORDER — METHOCARBAMOL 500 MG PO TABS
500.0000 mg | ORAL_TABLET | Freq: Four times a day (QID) | ORAL | Status: DC | PRN
  Administered 2013-09-05: 500 mg via ORAL
  Filled 2013-09-04: qty 1

## 2013-09-04 MED ORDER — CEFAZOLIN SODIUM-DEXTROSE 2-3 GM-% IV SOLR
2.0000 g | Freq: Four times a day (QID) | INTRAVENOUS | Status: AC
  Administered 2013-09-05 (×2): 2 g via INTRAVENOUS
  Filled 2013-09-04 (×2): qty 50

## 2013-09-04 MED ORDER — HYDROMORPHONE HCL PF 1 MG/ML IJ SOLN: 0.5000 mg | INTRAMUSCULAR | Status: DC | PRN

## 2013-09-04 MED ORDER — KETOROLAC TROMETHAMINE 30 MG/ML IJ SOLN
INTRAMUSCULAR | Status: AC
  Filled 2013-09-04: qty 1

## 2013-09-04 MED ORDER — NEOSTIGMINE METHYLSULFATE 10 MG/10ML IV SOLN
INTRAVENOUS | Status: DC | PRN
  Administered 2013-09-04: 5 mg via INTRAVENOUS

## 2013-09-04 MED ORDER — GLYCOPYRROLATE 0.2 MG/ML IJ SOLN
INTRAMUSCULAR | Status: DC | PRN
  Administered 2013-09-04: .8 mg via INTRAVENOUS
  Administered 2013-09-04 (×2): 0.2 mg via INTRAVENOUS

## 2013-09-04 MED ORDER — LORATADINE 10 MG PO TABS
10.0000 mg | ORAL_TABLET | Freq: Every day | ORAL | Status: DC | PRN
  Filled 2013-09-04: qty 1

## 2013-09-04 MED ORDER — ALUM & MAG HYDROXIDE-SIMETH 200-200-20 MG/5ML PO SUSP: 30.0000 mL | ORAL | Status: DC | PRN

## 2013-09-04 MED ORDER — LACTATED RINGERS IV SOLN: INTRAVENOUS | Status: DC

## 2013-09-04 MED ORDER — ONDANSETRON HCL 4 MG/2ML IJ SOLN
INTRAMUSCULAR | Status: AC
  Filled 2013-09-04: qty 2

## 2013-09-04 MED ORDER — DEXAMETHASONE SODIUM PHOSPHATE 10 MG/ML IJ SOLN
10.0000 mg | Freq: Once | INTRAMUSCULAR | Status: DC
  Filled 2013-09-04: qty 1

## 2013-09-04 MED ORDER — ONDANSETRON HCL 4 MG/2ML IJ SOLN
INTRAMUSCULAR | Status: DC | PRN
  Administered 2013-09-04 (×2): 2 mg via INTRAVENOUS

## 2013-09-04 MED ORDER — NYSTATIN 100000 UNIT/GM EX CREA
1.0000 "application " | TOPICAL_CREAM | Freq: Two times a day (BID) | CUTANEOUS | Status: DC | PRN
  Filled 2013-09-04: qty 15

## 2013-09-04 MED ORDER — BISACODYL 10 MG RE SUPP: 10.0000 mg | Freq: Every day | RECTAL | Status: DC | PRN

## 2013-09-04 MED ORDER — SODIUM CHLORIDE 0.9 % IJ SOLN
INTRAMUSCULAR | Status: DC | PRN
  Administered 2013-09-04: 9 mL

## 2013-09-04 MED ORDER — ATENOLOL 50 MG PO TABS
50.0000 mg | ORAL_TABLET | Freq: Every day | ORAL | Status: DC
  Administered 2013-09-05: 50 mg via ORAL
  Filled 2013-09-04: qty 1

## 2013-09-04 MED ORDER — LACTATED RINGERS IV SOLN
INTRAVENOUS | Status: DC | PRN
  Administered 2013-09-04 (×2): via INTRAVENOUS

## 2013-09-04 MED ORDER — KETAMINE HCL 10 MG/ML IJ SOLN
INTRAMUSCULAR | Status: DC | PRN
  Administered 2013-09-04: 15 mg via INTRAVENOUS
  Administered 2013-09-04: 10 mg via INTRAVENOUS

## 2013-09-04 MED ORDER — ASPIRIN EC 325 MG PO TBEC
325.0000 mg | DELAYED_RELEASE_TABLET | Freq: Two times a day (BID) | ORAL | Status: DC
  Administered 2013-09-05: 325 mg via ORAL
  Filled 2013-09-04 (×3): qty 1

## 2013-09-04 MED ORDER — POTASSIUM CHLORIDE 2 MEQ/ML IV SOLN
INTRAVENOUS | Status: DC
  Administered 2013-09-04: 21:00:00 via INTRAVENOUS
  Filled 2013-09-04 (×3): qty 1000

## 2013-09-04 MED ORDER — KETAMINE HCL 10 MG/ML IJ SOLN
INTRAMUSCULAR | Status: AC
  Filled 2013-09-04: qty 1

## 2013-09-04 MED ORDER — MAGNESIUM CITRATE PO SOLN: 1.0000 | Freq: Once | ORAL | Status: AC | PRN

## 2013-09-04 MED ORDER — HYDROCODONE-ACETAMINOPHEN 7.5-325 MG PO TABS
1.0000 | ORAL_TABLET | ORAL | Status: DC
  Administered 2013-09-04 – 2013-09-05 (×3): 1 via ORAL
  Filled 2013-09-04 (×3): qty 1

## 2013-09-04 MED ORDER — SUCCINYLCHOLINE CHLORIDE 20 MG/ML IJ SOLN
INTRAMUSCULAR | Status: DC | PRN
  Administered 2013-09-04: 150 mg via INTRAVENOUS

## 2013-09-04 MED ORDER — FERROUS SULFATE 325 (65 FE) MG PO TABS
325.0000 mg | ORAL_TABLET | Freq: Three times a day (TID) | ORAL | Status: DC
  Administered 2013-09-05: 325 mg via ORAL
  Filled 2013-09-04 (×4): qty 1

## 2013-09-04 MED ORDER — PANTOPRAZOLE SODIUM 40 MG PO TBEC
80.0000 mg | DELAYED_RELEASE_TABLET | Freq: Every day | ORAL | Status: DC
  Administered 2013-09-05: 80 mg via ORAL
  Filled 2013-09-04: qty 2

## 2013-09-04 MED ORDER — DIPHENHYDRAMINE HCL 25 MG PO CAPS: 25.0000 mg | ORAL_CAPSULE | Freq: Four times a day (QID) | ORAL | Status: DC | PRN

## 2013-09-04 MED ORDER — GLYCOPYRROLATE 0.2 MG/ML IJ SOLN
INTRAMUSCULAR | Status: AC
  Filled 2013-09-04: qty 2

## 2013-09-04 MED ORDER — DEXAMETHASONE SODIUM PHOSPHATE 10 MG/ML IJ SOLN
INTRAMUSCULAR | Status: AC
  Filled 2013-09-04: qty 1

## 2013-09-04 MED ORDER — AMLODIPINE BESYLATE 5 MG PO TABS
5.0000 mg | ORAL_TABLET | Freq: Every morning | ORAL | Status: DC
  Administered 2013-09-05: 5 mg via ORAL
  Filled 2013-09-04: qty 1

## 2013-09-04 MED ORDER — EPHEDRINE SULFATE 50 MG/ML IJ SOLN
INTRAMUSCULAR | Status: AC
  Filled 2013-09-04: qty 1

## 2013-09-04 MED ORDER — CHLORTHALIDONE 25 MG PO TABS
25.0000 mg | ORAL_TABLET | Freq: Every day | ORAL | Status: DC
  Administered 2013-09-05: 25 mg via ORAL
  Filled 2013-09-04: qty 1

## 2013-09-04 MED ORDER — PHENOL 1.4 % MT LIQD
1.0000 | OROMUCOSAL | Status: DC | PRN
  Filled 2013-09-04: qty 177

## 2013-09-04 MED ORDER — POLYETHYLENE GLYCOL 3350 17 G PO PACK
17.0000 g | PACK | Freq: Two times a day (BID) | ORAL | Status: DC
  Administered 2013-09-04 – 2013-09-05 (×2): 17 g via ORAL

## 2013-09-04 MED ORDER — MIDAZOLAM HCL 2 MG/2ML IJ SOLN
INTRAMUSCULAR | Status: AC
  Filled 2013-09-04: qty 2

## 2013-09-04 MED ORDER — MENTHOL 3 MG MT LOZG
1.0000 | LOZENGE | OROMUCOSAL | Status: DC | PRN
  Filled 2013-09-04: qty 9

## 2013-09-04 MED ORDER — BUPIVACAINE LIPOSOME 1.3 % IJ SUSP
20.0000 mL | Freq: Once | INTRAMUSCULAR | Status: AC
  Administered 2013-09-04: 20 mL
  Filled 2013-09-04: qty 20

## 2013-09-04 MED ORDER — METOCLOPRAMIDE HCL 5 MG/ML IJ SOLN: 5.0000 mg | Freq: Three times a day (TID) | INTRAMUSCULAR | Status: DC | PRN

## 2013-09-04 MED ORDER — ZOLPIDEM TARTRATE 10 MG PO TABS: 10.0000 mg | ORAL_TABLET | Freq: Every evening | ORAL | Status: DC | PRN

## 2013-09-04 MED ORDER — DOCUSATE SODIUM 100 MG PO CAPS
100.0000 mg | ORAL_CAPSULE | Freq: Two times a day (BID) | ORAL | Status: DC
  Administered 2013-09-04 – 2013-09-05 (×2): 100 mg via ORAL

## 2013-09-04 MED ORDER — CELECOXIB 200 MG PO CAPS
200.0000 mg | ORAL_CAPSULE | Freq: Two times a day (BID) | ORAL | Status: DC
  Administered 2013-09-04 – 2013-09-05 (×2): 200 mg via ORAL
  Filled 2013-09-04 (×3): qty 1

## 2013-09-04 MED ORDER — BUPIVACAINE-EPINEPHRINE 0.25% -1:200000 IJ SOLN
INTRAMUSCULAR | Status: DC | PRN
  Administered 2013-09-04: 30 mL

## 2013-09-04 MED ORDER — FENTANYL CITRATE 0.05 MG/ML IJ SOLN
INTRAMUSCULAR | Status: AC
  Filled 2013-09-04: qty 5

## 2013-09-04 MED ORDER — PROMETHAZINE HCL 25 MG/ML IJ SOLN
6.2500 mg | INTRAMUSCULAR | Status: DC | PRN
  Administered 2013-09-04: 6.25 mg via INTRAVENOUS

## 2013-09-04 MED ORDER — CEFAZOLIN SODIUM-DEXTROSE 2-3 GM-% IV SOLR
2.0000 g | INTRAVENOUS | Status: AC
  Administered 2013-09-04: 2 g via INTRAVENOUS

## 2013-09-04 MED ORDER — ONDANSETRON HCL 4 MG PO TABS: 4.0000 mg | ORAL_TABLET | Freq: Four times a day (QID) | ORAL | Status: DC | PRN

## 2013-09-04 MED ORDER — CHLORHEXIDINE GLUCONATE 4 % EX LIQD: 60.0000 mL | Freq: Once | CUTANEOUS | Status: DC

## 2013-09-04 MED ORDER — MIDAZOLAM HCL 5 MG/5ML IJ SOLN
INTRAMUSCULAR | Status: DC | PRN
  Administered 2013-09-04: 1 mg via INTRAVENOUS

## 2013-09-04 MED ORDER — METOCLOPRAMIDE HCL 10 MG PO TABS: 5.0000 mg | ORAL_TABLET | Freq: Three times a day (TID) | ORAL | Status: DC | PRN

## 2013-09-04 MED ORDER — CISATRACURIUM BESYLATE (PF) 10 MG/5ML IV SOLN
INTRAVENOUS | Status: DC | PRN
  Administered 2013-09-04: 10 mg via INTRAVENOUS

## 2013-09-04 MED ORDER — ATENOLOL-CHLORTHALIDONE 50-25 MG PO TABS: 1.0000 | ORAL_TABLET | Freq: Every morning | ORAL | Status: DC

## 2013-09-04 SURGICAL SUPPLY — 49 items
BAG ZIPLOCK 12X15 (MISCELLANEOUS) ×2 IMPLANT
BANDAGE ELASTIC 6 VELCRO ST LF (GAUZE/BANDAGES/DRESSINGS) ×2 IMPLANT
BANDAGE ESMARK 6X9 LF (GAUZE/BANDAGES/DRESSINGS) IMPLANT
BEARING MENISCAL TIBIAL 5 MD L (Orthopedic Implant) ×2 IMPLANT
BLADE SAW RECIPROCATING 77.5 (BLADE) ×2 IMPLANT
BLADE SAW SGTL 13.0X1.19X90.0M (BLADE) ×2 IMPLANT
BNDG ESMARK 6X9 LF (GAUZE/BANDAGES/DRESSINGS)
BOWL SMART MIX CTS (DISPOSABLE) ×2 IMPLANT
CUFF TOURN SGL QUICK 34 (TOURNIQUET CUFF) ×1
CUFF TRNQT CYL 34X4X40X1 (TOURNIQUET CUFF) ×1 IMPLANT
DERMABOND ADVANCED (GAUZE/BANDAGES/DRESSINGS)
DERMABOND ADVANCED .7 DNX12 (GAUZE/BANDAGES/DRESSINGS) IMPLANT
DRAPE EXTREMITY T 121X128X90 (DRAPE) ×2 IMPLANT
DRAPE POUCH INSTRU U-SHP 10X18 (DRAPES) ×2 IMPLANT
DRAPE U-SHAPE 47X51 STRL (DRAPES) ×2 IMPLANT
DRSG AQUACEL AG ADV 3.5X10 (GAUZE/BANDAGES/DRESSINGS) ×2 IMPLANT
DRSG TEGADERM 4X4.75 (GAUZE/BANDAGES/DRESSINGS) IMPLANT
DURAPREP 26ML APPLICATOR (WOUND CARE) ×4 IMPLANT
ELECT REM PT RETURN 9FT ADLT (ELECTROSURGICAL) ×2
ELECTRODE REM PT RTRN 9FT ADLT (ELECTROSURGICAL) ×1 IMPLANT
EVACUATOR 1/8 PVC DRAIN (DRAIN) ×2 IMPLANT
FACESHIELD WRAPAROUND (MASK) ×8 IMPLANT
GAUZE SPONGE 2X2 8PLY STRL LF (GAUZE/BANDAGES/DRESSINGS) IMPLANT
GLOVE BIOGEL PI IND STRL 7.5 (GLOVE) ×1 IMPLANT
GLOVE BIOGEL PI IND STRL 8 (GLOVE) ×1 IMPLANT
GLOVE BIOGEL PI INDICATOR 7.5 (GLOVE) ×1
GLOVE BIOGEL PI INDICATOR 8 (GLOVE) ×1
GLOVE ECLIPSE 8.0 STRL XLNG CF (GLOVE) ×2 IMPLANT
GLOVE ORTHO TXT STRL SZ7.5 (GLOVE) ×4 IMPLANT
GOWN SPEC L3 XXLG W/TWL (GOWN DISPOSABLE) ×2 IMPLANT
GOWN STRL REUS W/TWL LRG LVL3 (GOWN DISPOSABLE) ×2 IMPLANT
KIT BASIN OR (CUSTOM PROCEDURE TRAY) ×2 IMPLANT
LEGGING LITHOTOMY PAIR STRL (DRAPES) ×2 IMPLANT
MANIFOLD NEPTUNE II (INSTRUMENTS) ×2 IMPLANT
NDL SAFETY ECLIPSE 18X1.5 (NEEDLE) ×1 IMPLANT
NEEDLE HYPO 18GX1.5 SHARP (NEEDLE) ×1
PACK TOTAL JOINT (CUSTOM PROCEDURE TRAY) ×2 IMPLANT
POSITIONER SURGICAL ARM (MISCELLANEOUS) IMPLANT
SPONGE GAUZE 2X2 STER 10/PKG (GAUZE/BANDAGES/DRESSINGS)
SUCTION FRAZIER TIP 10 FR DISP (SUCTIONS) ×2 IMPLANT
SUT MNCRL AB 4-0 PS2 18 (SUTURE) ×2 IMPLANT
SUT VIC AB 1 CT1 36 (SUTURE) ×2 IMPLANT
SUT VIC AB 2-0 CT1 27 (SUTURE) ×2
SUT VIC AB 2-0 CT1 TAPERPNT 27 (SUTURE) ×2 IMPLANT
SUT VLOC 180 0 24IN GS25 (SUTURE) ×2 IMPLANT
SYR 50ML LL SCALE MARK (SYRINGE) ×2 IMPLANT
TOWEL OR 17X26 10 PK STRL BLUE (TOWEL DISPOSABLE) ×2 IMPLANT
TOWEL OR NON WOVEN STRL DISP B (DISPOSABLE) IMPLANT
TRAY FOLEY CATH 14FRSI W/METER (CATHETERS) IMPLANT

## 2013-09-04 NOTE — H&P (Signed)
UNICOMPARTMENTAL KNEE REVISION ADMISSION H&P  Patient is being admitted for left revision of poly vs full unicompartmental revision.  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  HPI: [name], 48 y.o. male, had a medial unicompartmental arthroplasty a year and ten months ago. He was doing well but three weeks ago he was outside and he twisted his knee and felt something shift or move. He went to the emergency room and immediately went on crutches and was noted to have a dislocated polyethylene from his unicompartmental arthroplasty. He has been non-weightbearing on it. He comes in today for evaluation. He does note that if he moves his knee at all he can see something poking in the skin.  His knee shows no effusion. No unusual warmth or redness.   X-rays are reviewed and do show the dislocation of the polyethylene so I did not put him through range of motion or stress. His metal components appear to be well located without evidence of loosening.  Discussion was had with the patient regarding the procedure. Risks, benefits and expectations were discussed with the patient.  Risks including but not limited to the risk of anesthesia, blood clots, nerve damage, blood vessel damage, failure of the prosthesis, infection and up to and including death.  Patient understand the risks, benefits and expectations and wishes to proceed with surgery.  PCP: No primary provider on file.  D/C Plans:    Home with HHPT  Post-op Meds:       No Rx given  Tranexamic Acid:      is to be given  Decadron:      is to be given  FYI:     ASA post-op  Norco post-op   Patient Active Problem List   Diagnosis Date Noted  . 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  . HYPERTENSION, UNSPECIFIED 11/03/2008  . FATIGUE / MALAISE 11/03/2008  . CHEST PAIN-UNSPECIFIED 11/03/2008   Past Medical History  Diagnosis Date  . Hypertension   . PONV (postoperative nausea and  vomiting)   . Anxiety attack   . Arthritis   . Fatigue   . H/O hypokalemia   . GERD (gastroesophageal reflux disease)   . History of kidney stones   . Difficulty sleeping     Past Surgical History  Procedure Laterality Date  . Knee arthroscopy  2011 / 2012    left x2  . Cholecystectomy      2003  . Lithotripsy  2011  . Dental surgery    . Partial knee arthroplasty  11/15/2011    Procedure: UNICOMPARTMENTAL KNEE;  Surgeon: Mauri Pole, MD;  Location: [facility];  Service: Orthopedics;  Laterality: Left;    No prescriptions prior to admission   Allergies  Allergen Reactions  . Codeine Nausea Only and Swelling    Reaction=swelling,chest tightness,itching,nausea  . Percocet [Oxycodone-Acetaminophen] Itching, Nausea Only and Swelling    Reaction=chest tightness    History  Substance Use Topics  . Smoking status: Current Every Day Smoker -- 1.00 packs/day  . Smokeless tobacco: Not on file  . Alcohol Use: Yes     Comment: rare    Family History  Problem Relation Age of Onset  . Coronary artery disease Other      Review of Systems  Constitutional: Positive for malaise/fatigue.  HENT: Negative.   Eyes: Negative.   Respiratory: Negative.   Cardiovascular: Negative.   Gastrointestinal: Positive for heartburn.  Genitourinary: Negative.  Musculoskeletal: Positive for joint pain.  Skin: Negative.   Neurological: Negative.   Endo/Heme/Allergies: Negative.   Psychiatric/Behavioral: The patient is nervous/anxious.      Objective:  Physical Exam  Constitutional: He is oriented to person, place, and time. He appears well-developed and well-nourished.  HENT:  Head: Normocephalic and atraumatic.  Mouth/Throat: Oropharynx is clear and moist.  Eyes: Pupils are equal, round, and reactive to light.  Neck: Neck supple. No JVD present. No tracheal deviation present. No thyromegaly present.  Cardiovascular: Normal rate, regular rhythm, normal heart sounds and intact distal pulses.    Respiratory: Effort normal and breath sounds normal. No respiratory distress. He has no wheezes.  GI: Soft. There is no tenderness. There is no guarding.  Musculoskeletal:       Left knee: He exhibits decreased range of motion, swelling and bony tenderness. He exhibits no ecchymosis, no deformity, no laceration and no erythema. Tenderness found. Medial joint line tenderness noted. No lateral joint line tenderness noted.  Lymphadenopathy:    He has no cervical adenopathy.  Neurological: He is alert and oriented to person, place, and time.  Skin: Skin is warm and dry.  Psychiatric: He has a normal mood and affect.    Vital signs in last 24 hours: Weight:  [118.842 kg (262 lb)] 118.842 kg (262 lb) (06/02 1240)  Labs:  Estimated body mass index is 33.62 kg/(m^2) as calculated from the following:   Height as of this encounter: 6\' 2"  (1.88 m).   Weight as of this encounter: 118.842 kg (262 lb).  Imaging Review Plain radiographs demonstrate previous medial unicompartmental knee arthroplastyof the left knee with dislocated poly. The overall alignment is neutral.   The bone quality appears to be good for age and reported activity level.   Assessment/Plan:  Left knee(s) with failed previous arthroplasty.   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 Revision poly od the knee arthroplasty is deemed medically necessary. The treatment options including medical management, injection therapy, arthroscopy and revision arthroplasty were discussed at length. The risks and benefits of revision total knee arthroplasty were presented and reviewed. The risks due to aseptic loosening, infection, stiffness, patella tracking problems, thromboembolic complications and other imponderables were discussed. The patient acknowledged the explanation,  agreed to proceed with the plan and consent was signed. Patient is being admitted for inpatient treatment for surgery, pain control, PT, OT, prophylactic antibiotics, VTE prophylaxis, progressive ambulation and ADL's and discharge planning.The patient is planning to be discharged home with home health services.   West Pugh Felise Georgia   PA-C  09/04/2013, 11:48 AM

## 2013-09-04 NOTE — Interval H&P Note (Signed)
History and Physical Interval Note:  09/04/2013 3:35 PM  [name]  has presented today for surgery, with the diagnosis of STATUS POST MEDIAL UNICOMPARTMENTAL ARTHROPLASTY LEFT KNEE WITH POLY DISLOCATION  The various methods of treatment have been discussed with the patient and family. After consideration of risks, benefits and other options for treatment, the patient has consented to  Procedure(s): REVISION POLY LEFT UNICOMPARTMENTAL ARTHROPLASTY VS FULL UNI REVISION (Left) as a surgical intervention .  The patient's history has been reviewed, patient examined, no change in status, stable for surgery.  I have reviewed the patient's chart and labs.  Questions were answered to the patient's satisfaction.     Mauri Pole

## 2013-09-04 NOTE — Brief Op Note (Signed)
09/04/2013  6:24 PM  PATIENT:  Halford Chessman Dercole  48 y.o. male  PRE-OPERATIVE DIAGNOSIS:  Failed left partial knee, dislocated polyethylene insert  POST-OPERATIVE DIAGNOSIS: Failed left partial knee, dislocated polyethylene insert  PROCEDURE:  Procedure(s): REVISION POLY LEFT UNICOMPARTMENTAL ARTHROPLASTY (Left), one component  SURGEON:  Surgeon(s) and Role:    * Mauri Pole, MD - Primary  PHYSICIAN ASSISTANT: Danae Orleans, PA-C  ANESTHESIA:   general  EBL:  Total I/O In: 1250 [I.V.:1250] Out: -   BLOOD ADMINISTERED:none  DRAINS: none   LOCAL MEDICATIONS USED:  Exparel, marcaine combination  SPECIMEN:  No Specimen  DISPOSITION OF SPECIMEN:  N/A  COUNTS:  YES  TOURNIQUET:   Total Tourniquet Time Documented: Thigh (Left) - 31 minutes Total: Thigh (Left) - 31 minutes   DICTATION: .Other Dictation: Dictation Number [id number]  PLAN OF CARE: Admit for overnight observation  PATIENT DISPOSITION:  PACU - hemodynamically stable.   Delay start of Pharmacological VTE agent (>24hrs) due to surgical blood loss or risk of bleeding: no

## 2013-09-04 NOTE — Transfer of Care (Signed)
Immediate Anesthesia Transfer of Care Note  Patient: [name]  Procedure(s) Performed: Procedure(s): REVISION POLY LEFT UNICOMPARTMENTAL ARTHROPLASTY (Left)  Patient Location: PACU  Anesthesia Type:General  Level of Consciousness: awake, sedated and patient cooperative  Airway & Oxygen Therapy: Patient Spontanous Breathing and Patient connected to face mask oxygen  Post-op Assessment: Report given to PACU RN and Post -op Vital signs reviewed and stable  Post vital signs: Reviewed and stable  Complications: No apparent anesthesia complications

## 2013-09-04 NOTE — Anesthesia Preprocedure Evaluation (Signed)
Anesthesia Evaluation  Patient identified by MRN, date of birth, ID band Patient awake    Reviewed: Allergy & Precautions, H&P , NPO status , Patient's Chart, lab work & pertinent test results  History of Anesthesia Complications (+) PONV  Airway Mallampati: II TM Distance: >3 FB Neck ROM: Full    Dental  (+) Teeth Intact, Dental Advisory Given   Pulmonary Current Smoker,  breath sounds clear to auscultation  Pulmonary exam normal       Cardiovascular hypertension, Pt. on medications Rhythm:Regular Rate:Normal     Neuro/Psych negative neurological ROS  negative psych ROS   GI/Hepatic negative GI ROS, Neg liver ROS, GERD-  ,  Endo/Other  negative endocrine ROS  Renal/GU negative Renal ROS  negative genitourinary   Musculoskeletal negative musculoskeletal ROS (+)   Abdominal   Peds  Hematology negative hematology ROS (+)   Anesthesia Other Findings   Reproductive/Obstetrics                           Anesthesia Physical Anesthesia Plan  ASA: II  Anesthesia Plan: General   Post-op Pain Management:    Induction: Intravenous  Airway Management Planned: Oral ETT  Additional Equipment:   Intra-op Plan:   Post-operative Plan: Extubation in OR  Informed Consent: I have reviewed the patients History and Physical, chart, labs and discussed the procedure including the risks, benefits and alternatives for the proposed anesthesia with the patient or authorized representative who has indicated his/her understanding and acceptance.   Dental advisory given  Plan Discussed with: CRNA  Anesthesia Plan Comments:         Anesthesia Quick Evaluation

## 2013-09-05 ENCOUNTER — Encounter (HOSPITAL_COMMUNITY): Payer: Self-pay | Admitting: Orthopedic Surgery

## 2013-09-05 DIAGNOSIS — E669 Obesity, unspecified: Secondary | ICD-10-CM | POA: Diagnosis present

## 2013-09-05 LAB — CBC
HCT: 45.5 % (ref 39.0–52.0)
Hemoglobin: 15.6 g/dL (ref 13.0–17.0)
MCH: 29 pg (ref 26.0–34.0)
MCHC: 34.3 g/dL (ref 30.0–36.0)
MCV: 84.6 fL (ref 78.0–100.0)
PLATELETS: 163 10*3/uL (ref 150–400)
RBC: 5.38 MIL/uL (ref 4.22–5.81)
RDW: 12.9 % (ref 11.5–15.5)
WBC: 10.5 10*3/uL (ref 4.0–10.5)

## 2013-09-05 LAB — BASIC METABOLIC PANEL
BUN: 19 mg/dL (ref 6–23)
CALCIUM: 8.8 mg/dL (ref 8.4–10.5)
CO2: 29 mEq/L (ref 19–32)
Chloride: 99 mEq/L (ref 96–112)
Creatinine, Ser: 0.96 mg/dL (ref 0.50–1.35)
GFR calc Af Amer: >90 mL/min (ref >90)
GFR calc non Af Amer: >90 mL/min (ref >90)
Glucose, Bld: 96 mg/dL (ref 70–99)
Potassium: 3.4 mEq/L — ABNORMAL LOW (ref 3.7–5.3)
SODIUM: 139 meq/L (ref 137–147)

## 2013-09-05 MED ORDER — FERROUS SULFATE 325 (65 FE) MG PO TABS: 325.0000 mg | ORAL_TABLET | Freq: Three times a day (TID) | ORAL | Status: DC

## 2013-09-05 MED ORDER — ASPIRIN 325 MG PO TBEC: 325.0000 mg | DELAYED_RELEASE_TABLET | Freq: Two times a day (BID) | ORAL | Status: DC

## 2013-09-05 MED ORDER — METHOCARBAMOL 500 MG PO TABS: 500.0000 mg | ORAL_TABLET | Freq: Four times a day (QID) | ORAL | Status: DC | PRN

## 2013-09-05 MED ORDER — DSS 100 MG PO CAPS: 100.0000 mg | ORAL_CAPSULE | Freq: Two times a day (BID) | ORAL | Status: DC

## 2013-09-05 MED ORDER — HYDROCODONE-ACETAMINOPHEN 7.5-325 MG PO TABS: 1.0000 | ORAL_TABLET | ORAL | Status: DC | PRN

## 2013-09-05 MED ORDER — POLYETHYLENE GLYCOL 3350 17 G PO PACK: 17.0000 g | PACK | Freq: Two times a day (BID) | ORAL | Status: DC

## 2013-09-05 NOTE — Progress Notes (Signed)
OT Cancellation Note  Patient Details Name: [name] MRN: [id number] DOB: 06-Jul-1965   Cancelled Treatment:    Reason Eval/Treat Not Completed: Other (comment) PT screened for OT and PT reports no OT needs.   Alycia Patten Linzy Laury [phone] 09/05/2013, 12:24 PM

## 2013-09-05 NOTE — Progress Notes (Signed)
   Subjective: 1 Day Post-Op Procedure(s) (LRB): REVISION POLY LEFT UNICOMPARTMENTAL ARTHROPLASTY (Left)   Seen by Dr. Alvan Dame Patient reports pain as mild, pain controlled. No events throughout the night. Denies any CP or SOB.  Ready to be discharged home.   Objective:   VITALS:   Filed Vitals:   09/05/13  BP: 128/80  Pulse: 53  Temp: 97.4 F (36.3 C)   Resp: 16    Neurovascular intact Dorsiflexion/Plantar flexion intact Incision: dressing C/D/I No cellulitis present Compartment soft  LABS  Recent Labs  09/04/13 1340 09/05/13 0435  HGB 17.2* 15.6  HCT 48.1 45.5  WBC 7.4 10.5  PLT 173 163     Recent Labs  09/04/13 1340 09/05/13 0435  NA 141 139  K 3.6* 3.4*  BUN 16 19  CREATININE 0.85 0.96  GLUCOSE 95 96     Assessment/Plan: 1 Day Post-Op Procedure(s) (LRB): REVISION POLY LEFT UNICOMPARTMENTAL ARTHROPLASTY (Left) Advance diet Up with therapy D/C IV fluids Discharge home with home health Follow up in 2 weeks at [facility]. Follow up with OLIN,[place] Silveria D in 2 weeks.  Contact information:  [facility] [address] [place] [phone]    Obese (BMI 30-39.9) Estimated body mass index is 33.5 kg/(m^2) as calculated from the following:   Height as of this encounter: 6\' 2"  (1.88 m).   Weight as of this encounter: 118.389 kg (261 lb). Patient also counseled that weight may inhibit the healing process Patient counseled that losing weight will help with future health issues       West Pugh. Dru Primeau   PAC  09/05/2013, 8:28 AM

## 2013-09-05 NOTE — Evaluation (Signed)
Physical Therapy One Time Evaluation Patient Details Name: [name] MRN: [id number] DOB: 05-24-1965 Today's Date: 09/05/2013   History of Present Illness  Pt is a 48 year old male s/p revision poly L unicompartmenal arthoplasty due to failed left partial knee, dislocated polyethylene insert  Clinical Impression  Patient evaluated by Physical Therapy with no further acute PT needs identified. All education has been completed and the patient has no further questions.  Pt performing mobility very well and to d/c home.  Pt declines follow up PT so reviewed exercises in detail and how to progress.  Pt had no further questions.  PT is signing off. Thank you for this referral.     Follow Up Recommendations No PT follow up (pt declines HHPT since surgery was revision)    Equipment Recommendations  None recommended by PT    Recommendations for Other Services       Precautions / Restrictions Precautions Precautions: Knee Restrictions Weight Bearing Restrictions: No Other Position/Activity Restrictions: WBAT      Mobility  Bed Mobility Overal bed mobility: Modified Independent                Transfers Overall transfer level: Modified independent Equipment used: Crutches Transfers: Sit to/from Stand Sit to Stand: Modified independent (Device/Increase time)            Ambulation/Gait Ambulation/Gait assistance: Supervision;Modified independent (Device/Increase time) Ambulation Distance (Feet): 320 Feet Assistive device: Crutches Gait Pattern/deviations: Step-through pattern Gait velocity: proper use of crutches, verbal cues for heel strike   General Gait Details: proper use of crutches, verbal cues for heel strike  Stairs            Wheelchair Mobility    Modified Rankin (Stroke Patients Only)       Balance                                             Pertinent Vitals/Pain Reports good pain control, activity to tolerance, ice  packs applied    Home Living Family/patient expects to be discharged to:: Private residence Living Arrangements: Spouse/significant other   Type of Home: House Home Access: Stairs to enter Entrance Stairs-Rails: None Technical brewer of Steps: 5 Home Layout: One level Home Equipment: Crutches      Prior Function Level of Independence: Independent with assistive device(s)         Comments: using crutches for 3 weeks, works in [employer]        Extremity/Trunk Assessment               Lower Extremity Assessment: LLE deficits/detail   LLE Deficits / Details: L knee AROM -5-100 degrees, good quad contraction, able to perform SLR     Communication   Communication: No difficulties  Cognition Arousal/Alertness: Awake/alert Behavior During Therapy: WFL for tasks assessed/performed Overall Cognitive Status: Within Functional Limits for tasks assessed                      General Comments      Exercises Total Joint Exercises Heel Slides: AROM;Seated;15 reps;Left Hip ABduction/ADduction: AROM;Supine;15 reps;Left Straight Leg Raises: AROM;Left;15 reps;Supine Long Arc Quad: AROM;Seated;Left;15 reps Other Exercises Other Exercises: pt recalls exercises from previous surgery well and still has handouts, thoroughly discussed 10-15 reps for each exercises twice a day and how to progress to standing  exercises Other Exercises: PT demonstrated and pt performed 2-3 reps to ensure performing correctly standing exercises: hip flexion, abduction, and extension, ham curls, and heel raises      Assessment/Plan    PT Assessment Patent does not need any further PT services  PT Diagnosis     PT Problem List    PT Treatment Interventions     PT Goals (Current goals can be found in the Care Plan section) Acute Rehab PT Goals PT Goal Formulation: No goals set, d/c therapy    Frequency     Barriers to discharge        Co-evaluation                End of Session   Activity Tolerance: Patient tolerated treatment well Patient left: in bed;with call bell/phone within reach Nurse Communication: Mobility status         Time: 9323-5573 PT Time Calculation (min): 29 min   Charges:   PT Evaluation $Initial PT Evaluation Tier I: 1 Procedure PT Treatments $Gait Training: 8-22 mins $Therapeutic Exercise: 8-22 mins   PT G Codes:          Junius Argyle 09/05/2013, 11:00 AM Carmelia Bake, PT, DPT 09/05/2013 Pager: [phone]

## 2013-09-05 NOTE — Progress Notes (Signed)
Utilization review completed.  

## 2013-09-05 NOTE — Op Note (Signed)
[name] NO.:  [id number]  MEDICAL RECORD NO.:  [id number]  LOCATION:  1191                         FACILITY:  [facility]  PHYSICIAN:  Pietro Cassis. Alvan Dame, M.D.  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  PROCEDURE:  Revision of left partial knee arthroplasty exchanging down to size 3 tibial polyethylene insert to a size 5 insert.  SURGEON:  Pietro Cassis. Alvan Dame, M.D.  ASSISTANT:  Danae Orleans, PA-C.  Note that [name] was present for the entirety of the case and preoperative positioning, perioperative management, operative extremity, general facilitation of the case, and primary wound closure.  ANESTHESIA:  General.  SPECIMENS:  None.  COMPLICATIONS:  None.  DRAINS:  None.  TOURNIQUET TIME:  30 minutes at 250 mmHg.  INDICATIONS FOR PROCEDURE:  [name] with history of a left partial knee arthroplasty over a year and a half ago. He had done extremely well in the postop recovery and reportedly had restored his overall quality of life and retained employment.  He had a freak incident when he was working on a lawnmower with a twisting-type mechanism, he feeling a sharp pain and pop.  He was taken to the emergency room where radiographs revealed the evidence of this polyethylene insert dislodging from the tibial femoral joint.  He was seen and evaluated in our office and scheduled for surgery.  He had been having no problems with his knee what so ever.  No fevers, chills, night sweats, or other constitutional symptoms.  We discussed revision in terms of removing this insert and exchanging it for new one and probably upsizing  and provide stability.  Also, I have a chance to assess the joint.  Risks of infection, DVT of this occurring again were all discussed and reviewed.  Consent was obtained for benefit of pain relief.  PROCEDURE IN DETAIL:  The patient was brought to the operative theater. Once adequate anesthesia, preoperative antibiotics administered, Ancef, he was positioned supine.  His left lower extremity was then prepped and draped in sterile fashion with the thigh tourniquet placed.  Time-out was performed identifying the patient, planned procedure, and extremity.  His old incision was demarcated and skin incision made.  Soft tissue planes created followed by median arthrotomy.  Following entering the joint, we found clear synovial fluid and a dislodged polyethylene insert into the central portion of the joint.  This was removed.  Following exposure and synovectomy, I trialed with the size 4 and then the size 5 insert and elected to go with a size 5 insert for nothing more than just improve stability.  There did not appear to be evidence of any posterior osteophytes off the femur that would have potentially been causing dislodgement and its medial collateral ligament was completely intact.  Once this was ascertained, the final 5 insert was opened for a size medium femur and a left medial knee.  The insert was then snapped into place.  The knee was irrigated with normal saline solution.  The extensor mechanism was reapproximated using #1 Vicryl in interrupted and running fashion.  I injected the synovial capsular joint with 20 mL of Exparel, 30 mL of 0.25% Marcaine with epinephrine and 1 mL of Toradol. The remainder of the wound was then closed with 2-0 Vicryl and running 4- 0 Monocryl.  The knee was cleaned, dried and dressed sterilely using Dermabond and Aquacel dressing.  He was then brought to the recovery room in stable condition.  Findings were reviewed with his family, he will be  weightbearing as tolerated, and will be discharged home tomorrow.     Pietro Cassis Alvan Dame, M.D.     MDO/MEDQ  D:  09/04/2013  T:  09/05/2013  Job:  [id number]

## 2013-09-05 NOTE — Progress Notes (Signed)
Pt to d/c home. No PT follow-up required and pt has all recommended DME. AVS reviewed and "My Chart" discussed with pt. Pt capable of verbalizing medications, signs and symptoms of infection, and follow-up appointments. Remains hemodynamically stable. No signs and symptoms of distress. Educated pt to return to ER in the case of SOB, dizziness, or chest pain.

## 2013-09-06 NOTE — Discharge Summary (Signed)
Physician Discharge Summary  Patient ID: [name] MRN: [id number] DOB/AGE: 1965-12-19 48 y.o.  Admit date: 09/04/2013 Discharge date: 09/05/2013   Procedures:  Procedure(s) (LRB): REVISION POLY LEFT UNICOMPARTMENTAL ARTHROPLASTY (Left)  Attending Physician:  Dr. Paralee Cancel   Admission Diagnoses:   S/P left medial unicompartmental knee arthroplasty with poly dislocation  Discharge Diagnoses:  Active Problems:   S/P left UKR   Obese  Past Medical History  Diagnosis Date  . Hypertension   . PONV (postoperative nausea and vomiting)   . Anxiety attack   . Arthritis   . Fatigue   . H/O hypokalemia   . GERD (gastroesophageal reflux disease)   . History of kidney stones   . Difficulty sleeping     HPI: [name], 48 y.o. male, had a medial unicompartmental arthroplasty a year and ten months ago. He was doing well but three weeks ago he was outside and he twisted his knee and felt something shift or move. He went to the emergency room and immediately went on crutches and was noted to have a dislocated polyethylene from his unicompartmental arthroplasty. He has been non-weightbearing on it. He comes in today for evaluation. He does note that if he moves his knee at all he can see something poking in the skin. His knee shows no effusion. No unusual warmth or redness. X-rays are reviewed and do show the dislocation of the polyethylene so I did not put him through range of motion or stress. His metal components appear to be well located without evidence of loosening. Discussion was had with the patient regarding the procedure. Risks, benefits and expectations were discussed with the patient. Risks including but not limited to the risk of anesthesia, blood clots, nerve damage, blood vessel damage, failure of the prosthesis, infection and up to and including death. Patient understand the risks, benefits and expectations and wishes to proceed with surgery.  PCP: No primary  provider on file.   Discharged Condition: good  Hospital Course:  Patient underwent the above stated procedure on 09/04/2013. Patient tolerated the procedure well and brought to the recovery room in good condition and subsequently to the floor.  POD #1 BP: 128/80 ; Pulse: 53 ; Temp: 97.4 F (36.3 C) ; Resp: 16 Patient reports pain as mild, pain controlled. No events throughout the night. Denies any CP or SOB. Ready to be discharged home.  Neurovascular intact, dorsiflexion/plantar flexion intact, incision: dressing C/D/I, no cellulitis present and compartment soft.   LABS  Basename    HGB  15.6  HCT  45.5    Discharge Exam: General appearance: alert, cooperative and no distress Extremities: Homans sign is negative, no sign of DVT, no edema, redness or tenderness in the calves or thighs and no ulcers, gangrene or trophic changes  Disposition: Home with follow up in 2 weeks   Follow-up Information   Follow up with Mauri Pole, MD. Schedule an appointment as soon as possible for a visit in 2 weeks.   Specialty:  Orthopedic Surgery   Contact information:   [address] [place] [address] [phone]       Discharge Instructions   Call MD / Call 911    Complete by:  As directed   If you experience chest pain or shortness of breath, CALL 911 and be transported to the hospital emergency room.  If you develope a fever above 101 F, pus (white drainage) or increased drainage or redness at the wound, or calf  pain, call your surgeon's office.     Change dressing    Complete by:  As directed   Maintain surgical dressing for 10-14 days, or until follow up in the clinic.     Constipation Prevention    Complete by:  As directed   Drink plenty of fluids.  Prune juice may be helpful.  You may use a stool softener, such as Colace (over the counter) 100 mg twice a day.  Use MiraLax (over the counter) for constipation as needed.     Diet - low sodium heart healthy     Complete by:  As directed      Discharge instructions    Complete by:  As directed   Maintain surgical dressing for 10-14 days, or until follow up in the clinic. Follow up in 2 weeks at [facility] Call with any questions or concerns.     Increase activity slowly as tolerated    Complete by:  As directed      TED hose    Complete by:  As directed   Use stockings (TED hose) for 2 weeks on both leg(s).  You may remove them at night for sleeping.     Weight bearing as tolerated    Complete by:  As directed              Medication List    STOP taking these medications       aspirin 81 MG chewable tablet  Replaced by:  aspirin 325 MG EC tablet     HYDROcodone-acetaminophen 5-325 MG per tablet  Commonly known as:  NORCO/VICODIN  Replaced by:  HYDROcodone-acetaminophen 7.5-325 MG per tablet     ibuprofen 200 MG tablet  Commonly known as:  ADVIL,MOTRIN      TAKE these medications       ALPRAZolam 0.5 MG tablet  Commonly known as:  XANAX  Take 0.5 mg by mouth 2 (two) times daily as needed. For anxiety     amLODipine 5 MG tablet  Commonly known as:  NORVASC  Take 5 mg by mouth every morning.     aspirin 325 MG EC tablet  Take 1 tablet (325 mg total) by mouth 2 (two) times daily.     atenolol-chlorthalidone 50-25 MG per tablet  Commonly known as:  TENORETIC  Take 1 tablet by mouth every morning.     DSS 100 MG Caps  Take 100 mg by mouth 2 (two) times daily.     ferrous sulfate 325 (65 FE) MG tablet  Take 1 tablet (325 mg total) by mouth 3 (three) times daily after meals.     Fish Oil 1000 MG Caps  Take 1,000 mg by mouth daily.     HYDROcodone-acetaminophen 7.5-325 MG per tablet  Commonly known as:  NORCO  Take 1-2 tablets by mouth every 4 (four) hours as needed for moderate pain.     lisinopril 40 MG tablet  Commonly known as:  PRINIVIL,ZESTRIL  Take 40 mg by mouth every morning.     loratadine 10 MG tablet  Commonly known as:  CLARITIN  Take 10  mg by mouth daily as needed. For allergies     methocarbamol 500 MG tablet  Commonly known as:  ROBAXIN  Take 1 tablet (500 mg total) by mouth every 6 (six) hours as needed for muscle spasms.     nystatin cream  Commonly known as:  MYCOSTATIN  Apply 1 application topically 2 (two) times daily as needed for dry  skin.     omeprazole 40 MG capsule  Commonly known as:  PRILOSEC  Take 40 mg by mouth every morning.     polyethylene glycol packet  Commonly known as:  MIRALAX / GLYCOLAX  Take 17 g by mouth 2 (two) times daily.     zolpidem 10 MG tablet  Commonly known as:  AMBIEN  Take 10 mg by mouth at bedtime as needed.         Signed: [place] Pugh. Michaline Kindig   PA-C  09/06/2013, 8:33 AM

## 2013-09-09 NOTE — Anesthesia Postprocedure Evaluation (Signed)
Anesthesia Post Note  Patient: [name]  Procedure(s) Performed: Procedure(s) (LRB): REVISION POLY LEFT UNICOMPARTMENTAL ARTHROPLASTY (Left)  Anesthesia type: General  Patient location: PACU  Post pain: Pain level controlled  Post assessment: Post-op Vital signs reviewed  Last Vitals:  Filed Vitals:   09/05/13 1200  BP:   Pulse:   Temp:   Resp: 16    Post vital signs: Reviewed  Level of consciousness: sedated  Complications: No apparent anesthesia complications

## 2014-10-25 ENCOUNTER — Emergency Department (HOSPITAL_COMMUNITY)
Admission: EM | Admit: 2014-10-25 | Discharge: 2014-10-26 | Disposition: A | Discharge status: Home / Self Care | Payer: Medicaid Other | Attending: Emergency Medicine | Admitting: Emergency Medicine

## 2014-10-25 ENCOUNTER — Encounter (HOSPITAL_COMMUNITY): Payer: Self-pay | Admitting: *Deleted

## 2014-10-25 DIAGNOSIS — Z720 Tobacco use: Secondary | ICD-10-CM | POA: Insufficient documentation

## 2014-10-25 DIAGNOSIS — Z79899 Other long term (current) drug therapy: Secondary | ICD-10-CM | POA: Diagnosis not present

## 2014-10-25 DIAGNOSIS — M1990 Unspecified osteoarthritis, unspecified site: Secondary | ICD-10-CM | POA: Insufficient documentation

## 2014-10-25 DIAGNOSIS — Z87442 Personal history of urinary calculi: Secondary | ICD-10-CM | POA: Insufficient documentation

## 2014-10-25 DIAGNOSIS — K219 Gastro-esophageal reflux disease without esophagitis: Secondary | ICD-10-CM | POA: Diagnosis not present

## 2014-10-25 DIAGNOSIS — I10 Essential (primary) hypertension: Secondary | ICD-10-CM | POA: Insufficient documentation

## 2014-10-25 DIAGNOSIS — Z7982 Long term (current) use of aspirin: Secondary | ICD-10-CM | POA: Diagnosis not present

## 2014-10-25 DIAGNOSIS — M722 Plantar fascial fibromatosis: Secondary | ICD-10-CM | POA: Diagnosis not present

## 2014-10-25 DIAGNOSIS — F419 Anxiety disorder, unspecified: Secondary | ICD-10-CM | POA: Diagnosis not present

## 2014-10-25 DIAGNOSIS — G479 Sleep disorder, unspecified: Secondary | ICD-10-CM | POA: Diagnosis not present

## 2014-10-25 DIAGNOSIS — M79671 Pain in right foot: Secondary | ICD-10-CM | POA: Diagnosis present

## 2014-10-25 HISTORY — DX: Fatty (change of) liver, not elsewhere classified: K76.0

## 2014-10-25 HISTORY — DX: Splenomegaly, not elsewhere classified: R16.1

## 2014-10-25 NOTE — Discharge Instructions (Signed)
Alternate ice and heat, and continue you`r stretching exercises Use Aleve twice a day for discomfort   Plantar Fasciitis Plantar fasciitis is a common condition that causes foot pain. It is soreness (inflammation) of the band of tough fibrous tissue on the bottom of the foot that runs from the heel bone (calcaneus) to the ball of the foot. The cause of this soreness may be from excessive standing, poor fitting shoes, running on hard surfaces, being overweight, having an abnormal walk, or overuse (this is common in runners) of the painful foot or feet. It is also common in aerobic exercise dancers and ballet dancers. SYMPTOMS  Most people with plantar fasciitis complain of:  Severe pain in the morning on the bottom of their foot especially when taking the first steps out of bed. This pain recedes after a few minutes of walking.  Severe pain is experienced also during walking following a long period of inactivity.  Pain is worse when walking barefoot or up stairs DIAGNOSIS   Your caregiver will diagnose this condition by examining and feeling your foot.  Special tests such as X-rays of your foot, are usually not needed. PREVENTION   Consult a sports medicine professional before beginning a new exercise program.  Walking programs offer a good workout. With walking there is a lower chance of overuse injuries common to runners. There is less impact and less jarring of the joints.  Begin all new exercise programs slowly. If problems or pain develop, decrease the amount of time or distance until you are at a comfortable level.  Wear good shoes and replace them regularly.  Stretch your foot and the heel cords at the back of the ankle (Achilles tendon) both before and after exercise.  Run or exercise on even surfaces that are not hard. For example, asphalt is better than pavement.  Do not run barefoot on hard surfaces.  If using a treadmill, vary the incline.  Do not continue to workout  if you have foot or joint problems. Seek professional help if they do not improve. HOME CARE INSTRUCTIONS   Avoid activities that cause you pain until you recover.  Use ice or cold packs on the problem or painful areas after working out.  Only take over-the-counter or prescription medicines for pain, discomfort, or fever as directed by your caregiver.  Soft shoe inserts or athletic shoes with air or gel sole cushions may be helpful.  If problems continue or become more severe, consult a sports medicine caregiver or your own health care provider. Cortisone is a potent anti-inflammatory medication that may be injected into the painful area. You can discuss this treatment with your caregiver. MAKE SURE YOU:   Understand these instructions.  Will watch your condition.  Will get help right away if you are not doing well or get worse. Document Released: 12/14/2000 Document Revised: 06/13/2011 Document Reviewed: 02/13/2008 [facility] Patient Information 2015 [place], [place]. This information is not intended to replace advice given to you by your health care provider. Make sure you discuss any questions you have with your health care provider.  Plantar Fasciitis (Heel Spur Syndrome) with Rehab The plantar fascia is a fibrous, ligament-like, soft-tissue structure that spans the bottom of the foot. Plantar fasciitis is a condition that causes pain in the foot due to inflammation of the tissue. SYMPTOMS   Pain and tenderness on the underneath side of the foot.  Pain that worsens with standing or walking. CAUSES  Plantar fasciitis is caused by irritation and injury  to the plantar fascia on the underneath side of the foot. Common mechanisms of injury include:  Direct trauma to bottom of the foot.  Damage to a small nerve that runs under the foot where the main fascia attaches to the heel bone.  Stress placed on the plantar fascia due to bone spurs. RISK INCREASES WITH:   Activities that  place stress on the plantar fascia (running, jumping, pivoting, or cutting).  Poor strength and flexibility.  Improperly fitted shoes.  Tight calf muscles.  Flat feet.  Failure to warm-up properly before activity.  Obesity. PREVENTION  Warm up and stretch properly before activity.  Allow for adequate recovery between workouts.  Maintain physical fitness:  Strength, flexibility, and endurance.  Cardiovascular fitness.  Maintain a health body weight.  Avoid stress on the plantar fascia.  Wear properly fitted shoes, including arch supports for individuals who have flat feet. PROGNOSIS  If treated properly, then the symptoms of plantar fasciitis usually resolve without surgery. However, occasionally surgery is necessary. RELATED COMPLICATIONS   Recurrent symptoms that may result in a chronic condition.  Problems of the lower back that are caused by compensating for the injury, such as limping.  Pain or weakness of the foot during push-off following surgery.  Chronic inflammation, scarring, and partial or complete fascia tear, occurring more often from repeated injections. TREATMENT  Treatment initially involves the use of ice and medication to help reduce pain and inflammation. The use of strengthening and stretching exercises may help reduce pain with activity, especially stretches of the Achilles tendon. These exercises may be performed at home or with a therapist. Your caregiver may recommend that you use heel cups of arch supports to help reduce stress on the plantar fascia. Occasionally, corticosteroid injections are given to reduce inflammation. If symptoms persist for greater than 6 months despite non-surgical (conservative), then surgery may be recommended.  MEDICATION   If pain medication is necessary, then nonsteroidal anti-inflammatory medications, such as aspirin and ibuprofen, or other minor pain relievers, such as acetaminophen, are often recommended.  Do not  take pain medication within 7 days before surgery.  Prescription pain relievers may be given if deemed necessary by your caregiver. Use only as directed and only as much as you need.  Corticosteroid injections may be given by your caregiver. These injections should be reserved for the most serious cases, because they may only be given a certain number of times. HEAT AND COLD  Cold treatment (icing) relieves pain and reduces inflammation. Cold treatment should be applied for 10 to 15 minutes every 2 to 3 hours for inflammation and pain and immediately after any activity that aggravates your symptoms. Use ice packs or massage the area with a piece of ice (ice massage).  Heat treatment may be used prior to performing the stretching and strengthening activities prescribed by your caregiver, physical therapist, or athletic trainer. Use a heat pack or soak the injury in warm water. SEEK IMMEDIATE MEDICAL CARE IF:  Treatment seems to offer no benefit, or the condition worsens.  Any medications produce adverse side effects. EXERCISES RANGE OF MOTION (ROM) AND STRETCHING EXERCISES - Plantar Fasciitis (Heel Spur Syndrome) These exercises may help you when beginning to rehabilitate your injury. Your symptoms may resolve with or without further involvement from your physician, physical therapist or athletic trainer. While completing these exercises, remember:   Restoring tissue flexibility helps normal motion to return to the joints. This allows healthier, less painful movement and activity.  An effective stretch  should be held for at least 30 seconds.  A stretch should never be painful. You should only feel a gentle lengthening or release in the stretched tissue. RANGE OF MOTION - Toe Extension, Flexion  Sit with your right / left leg crossed over your opposite knee.  Grasp your toes and gently pull them back toward the top of your foot. You should feel a stretch on the bottom of your toes and/or  foot.  Hold this stretch for __________ seconds.  Now, gently pull your toes toward the bottom of your foot. You should feel a stretch on the top of your toes and or foot.  Hold this stretch for __________ seconds. Repeat __________ times. Complete this stretch __________ times per day.  RANGE OF MOTION - Ankle Dorsiflexion, Active Assisted  Remove shoes and sit on a chair that is preferably not on a carpeted surface.  Place right / left foot under knee. Extend your opposite leg for support.  Keeping your heel down, slide your right / left foot back toward the chair until you feel a stretch at your ankle or calf. If you do not feel a stretch, slide your bottom forward to the edge of the chair, while still keeping your heel down.  Hold this stretch for __________ seconds. Repeat __________ times. Complete this stretch __________ times per day.  STRETCH - Gastroc, Standing  Place hands on wall.  Extend right / left leg, keeping the front knee somewhat bent.  Slightly point your toes inward on your back foot.  Keeping your right / left heel on the floor and your knee straight, shift your weight toward the wall, not allowing your back to arch.  You should feel a gentle stretch in the right / left calf. Hold this position for __________ seconds. Repeat __________ times. Complete this stretch __________ times per day. STRETCH - Soleus, Standing  Place hands on wall.  Extend right / left leg, keeping the other knee somewhat bent.  Slightly point your toes inward on your back foot.  Keep your right / left heel on the floor, bend your back knee, and slightly shift your weight over the back leg so that you feel a gentle stretch deep in your back calf.  Hold this position for __________ seconds. Repeat __________ times. Complete this stretch __________ times per day. STRETCH - Gastrocsoleus, Standing  Note: This exercise can place a lot of stress on your foot and ankle. Please  complete this exercise only if specifically instructed by your caregiver.   Place the ball of your right / left foot on a step, keeping your other foot firmly on the same step.  Hold on to the wall or a rail for balance.  Slowly lift your other foot, allowing your body weight to press your heel down over the edge of the step.  You should feel a stretch in your right / left calf.  Hold this position for __________ seconds.  Repeat this exercise with a slight bend in your right / left knee. Repeat __________ times. Complete this stretch __________ times per day.  STRENGTHENING EXERCISES - Plantar Fasciitis (Heel Spur Syndrome)  These exercises may help you when beginning to rehabilitate your injury. They may resolve your symptoms with or without further involvement from your physician, physical therapist or athletic trainer. While completing these exercises, remember:   Muscles can gain both the endurance and the strength needed for everyday activities through controlled exercises.  Complete these exercises as instructed by your  physician, physical therapist or athletic trainer. Progress the resistance and repetitions only as guided. STRENGTH - Towel Curls  Sit in a chair positioned on a non-carpeted surface.  Place your foot on a towel, keeping your heel on the floor.  Pull the towel toward your heel by only curling your toes. Keep your heel on the floor.  If instructed by your physician, physical therapist or athletic trainer, add ____________________ at the end of the towel. Repeat __________ times. Complete this exercise __________ times per day. STRENGTH - Ankle Inversion  Secure one end of a rubber exercise band/tubing to a fixed object (table, pole). Loop the other end around your foot just before your toes.  Place your fists between your knees. This will focus your strengthening at your ankle.  Slowly, pull your big toe up and in, making sure the band/tubing is positioned to  resist the entire motion.  Hold this position for __________ seconds.  Have your muscles resist the band/tubing as it slowly pulls your foot back to the starting position. Repeat __________ times. Complete this exercises __________ times per day.  Document Released: 03/21/2005 Document Revised: 06/13/2011 Document Reviewed: 07/03/2008 [facility] Patient Information 2015 [place], [place]. This information is not intended to replace advice given to you by your health care provider. Make sure you discuss any questions you have with your health care provider.

## 2014-10-25 NOTE — ED Provider Notes (Signed)
CSN: [id number]     Arrival date & time 10/25/14  2213 History   First MD Initiated Contact with Patient 10/25/14 2315     Chief Complaint  Patient presents with  . Foot Pain     (Consider location/radiation/quality/duration/timing/severity/associated sxs/prior Treatment) HPI   [name] is a 49 y.o. male who presents for evaluation of right foot pain present for 2 weeks, similar to prior episode of plantar fasciitis. The pain is described as a burning sensation and radiates to his right popliteal area. No recent trauma. He is trying stretching exercises, without relief. He has not taking any anti-inflammatory medication. There are no other known modifying factors.   Past Medical History  Diagnosis Date  . Hypertension   . PONV (postoperative nausea and vomiting)   . Anxiety attack   . Arthritis   . Fatigue   . H/O hypokalemia   . GERD (gastroesophageal reflux disease)   . History of kidney stones   . Difficulty sleeping   . Fatty liver disease, nonalcoholic   . Splenomegaly    Past Surgical History  Procedure Laterality Date  . Knee arthroscopy  2011 / 2012    left x2  . Cholecystectomy      2003  . Lithotripsy  2011  . Dental surgery    . Partial knee arthroplasty  11/15/2011    Procedure: UNICOMPARTMENTAL KNEE;  Surgeon: Mauri Pole, MD;  Location: [facility];  Service: Orthopedics;  Laterality: Left;  . Partial knee arthroplasty Left 09/04/2013    Procedure: REVISION POLY LEFT UNICOMPARTMENTAL ARTHROPLASTY;  Surgeon: Mauri Pole, MD;  Location: [facility];  Service: Orthopedics;  Laterality: Left;   Family History  Problem Relation Age of Onset  . Coronary artery disease Other   . Aneurysm Sister   . Stroke Sister    History  Substance Use Topics  . Smoking status: Current Every Day Smoker -- 1.00 packs/day  . Smokeless tobacco: Not on file  . Alcohol Use: Yes     Comment: rare    Review of Systems  All other systems reviewed and are  negative.     Allergies  Codeine and Percocet  Home Medications   Prior to Admission medications   Medication Sig Start Date End Date Taking? Authorizing Provider  ALPRAZolam Duanne Moron) 0.5 MG tablet Take 0.5 mg by mouth 2 (two) times daily as needed. For anxiety    Historical Provider, MD  amLODipine (NORVASC) 5 MG tablet Take 5 mg by mouth every morning.    Historical Provider, MD  aspirin EC 325 MG EC tablet Take 1 tablet (325 mg total) by mouth 2 (two) times daily. 09/05/13   Danae Orleans, PA-C  atenolol-chlorthalidone (TENORETIC) 50-25 MG per tablet Take 1 tablet by mouth every morning.    Historical Provider, MD  docusate sodium 100 MG CAPS Take 100 mg by mouth 2 (two) times daily. 09/05/13   Danae Orleans, PA-C  ferrous sulfate 325 (65 FE) MG tablet Take 1 tablet (325 mg total) by mouth 3 (three) times daily after meals. 09/05/13   Danae Orleans, PA-C  HYDROcodone-acetaminophen (NORCO) 7.5-325 MG per tablet Take 1-2 tablets by mouth every 4 (four) hours as needed for moderate pain. 09/05/13   Danae Orleans, PA-C  lisinopril (PRINIVIL,ZESTRIL) 40 MG tablet Take 40 mg by mouth every morning.    Historical Provider, MD  loratadine (CLARITIN) 10 MG tablet Take 10 mg by mouth daily as needed. For allergies    Historical Provider, MD  methocarbamol (ROBAXIN) 500 MG tablet Take 1 tablet (500 mg total) by mouth every 6 (six) hours as needed for muscle spasms. 09/05/13   Danae Orleans, PA-C  nystatin cream (MYCOSTATIN) Apply 1 application topically 2 (two) times daily as needed for dry skin.    Historical Provider, MD  Omega-3 Fatty Acids (FISH OIL) 1000 MG CAPS Take 1,000 mg by mouth daily.    Historical Provider, MD  omeprazole (PRILOSEC) 40 MG capsule Take 40 mg by mouth every morning.    Historical Provider, MD  polyethylene glycol (MIRALAX / GLYCOLAX) packet Take 17 g by mouth 2 (two) times daily. 09/05/13   Danae Orleans, PA-C  zolpidem (AMBIEN) 10 MG tablet Take 10 mg by mouth at bedtime as  needed.    Historical Provider, MD   BP 147/81 mmHg  Pulse 62  Temp(Src) 98.3 F (36.8 C) (Oral)  Resp 14  Ht 6\' 1"  (1.854 m)  Wt 213 lb (96.616 kg)  BMI 28.11 kg/m2  SpO2 99% Physical Exam  Constitutional: He is oriented to person, place, and time. He appears well-developed and well-nourished.  HENT:  Head: Normocephalic and atraumatic.  Right Ear: External ear normal.  Left Ear: External ear normal.  Eyes: Conjunctivae and EOM are normal. Pupils are equal, round, and reactive to light.  Neck: Normal range of motion and phonation normal. Neck supple.  Cardiovascular: Normal rate.   Pulmonary/Chest: Effort normal. He exhibits no bony tenderness.  Musculoskeletal: Normal range of motion.  Right foot, mildly tender of the plantar heel. No deformity. He has flat feet. No calf tenderness or swelling. Mild tenderness right popliteal region without mass or cord.  Neurological: He is alert and oriented to person, place, and time. No cranial nerve deficit or sensory deficit. He exhibits normal muscle tone. Coordination normal.  Skin: Skin is warm, dry and intact.  Psychiatric: He has a normal mood and affect. His behavior is normal. Judgment and thought content normal.  Nursing note and vitals reviewed.   ED Course  Procedures (including critical care time)  Findings discussed with patient and wife, all questions answered  Labs Review Labs Reviewed - No data to display  Imaging Review No results found.   EKG Interpretation None      MDM   Final diagnoses:  Plantar fasciitis of right foot    Recurrent plantar fasciitis. Doubt fracture, tendinitis, or cellulitis.  Nursing Notes Reviewed/ Care Coordinated Applicable Imaging Reviewed Interpretation of Laboratory Data incorporated into ED treatment  The patient appears reasonably screened and/or stabilized for discharge and I doubt any other medical condition or other [facility] requiring further screening, evaluation, or  treatment in the ED at this time prior to discharge.  Plan: Home Medications- Aleave; Home Treatments- heat, stretch; return here if the recommended treatment, does not improve the symptoms; Recommended follow up- PCP or Ortho prn     Daleen Bo, MD 10/25/14 2352

## 2014-10-25 NOTE — ED Notes (Addendum)
Pain in R heel x 2 weeks.  Pain is chronic from  plantar faciaitis, but has escalated to a burning pain that radiates to popliteal area over last 2 weeks. Had cortisone shot last year during fall.

## 2014-10-26 NOTE — ED Notes (Signed)
Patient verbalizes understanding of discharge instructions, home care and follow up care. Patient ambulatory out of department at this time with family. 

## 2015-06-05 IMAGING — CR DG CHEST 2V
2 series · 2 of 2 positions shown · non-contrast
Comparison: None.

CLINICAL DATA: Preop for knee surgery.

EXAM:
CHEST  2 VIEW

[w chest pa]
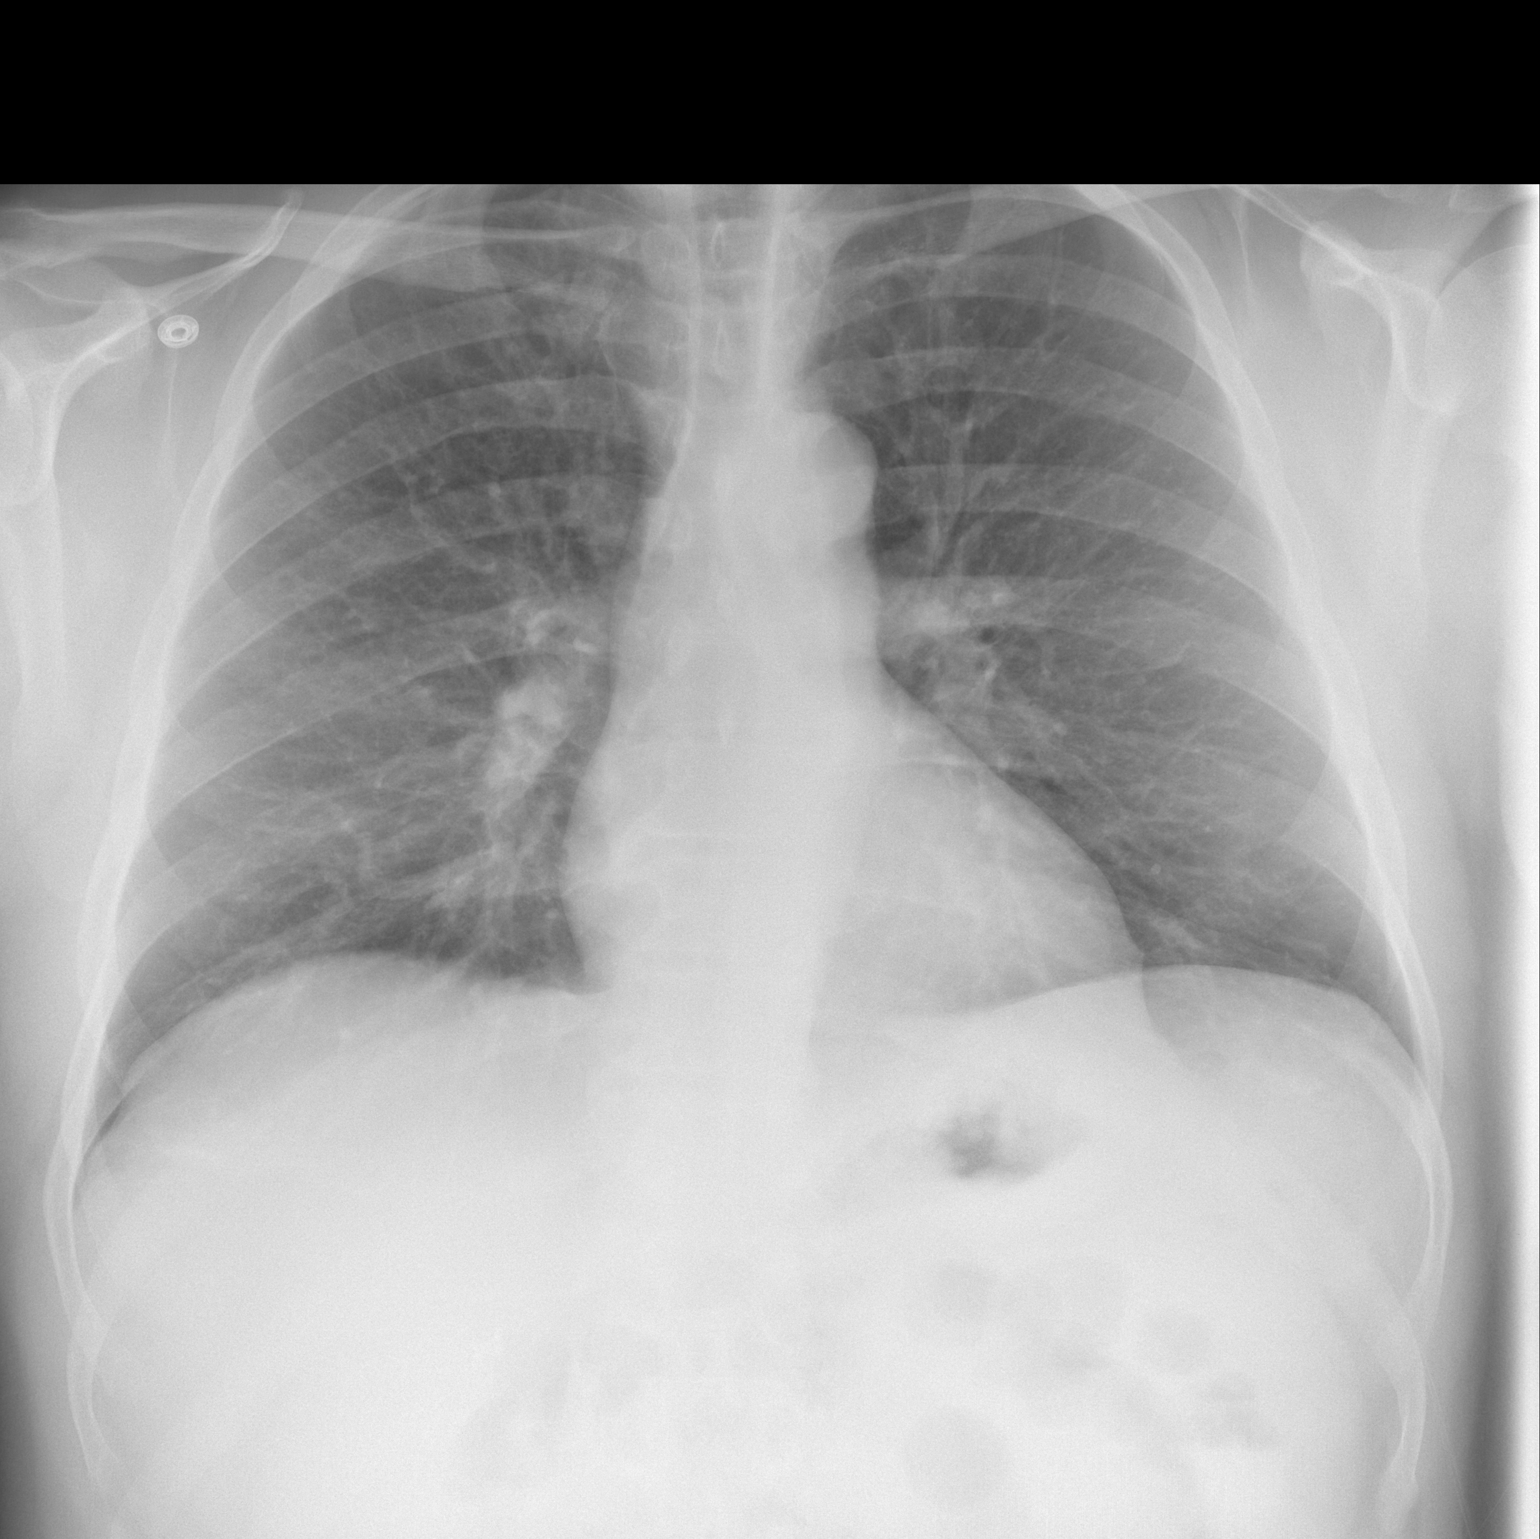

[w chest lat]
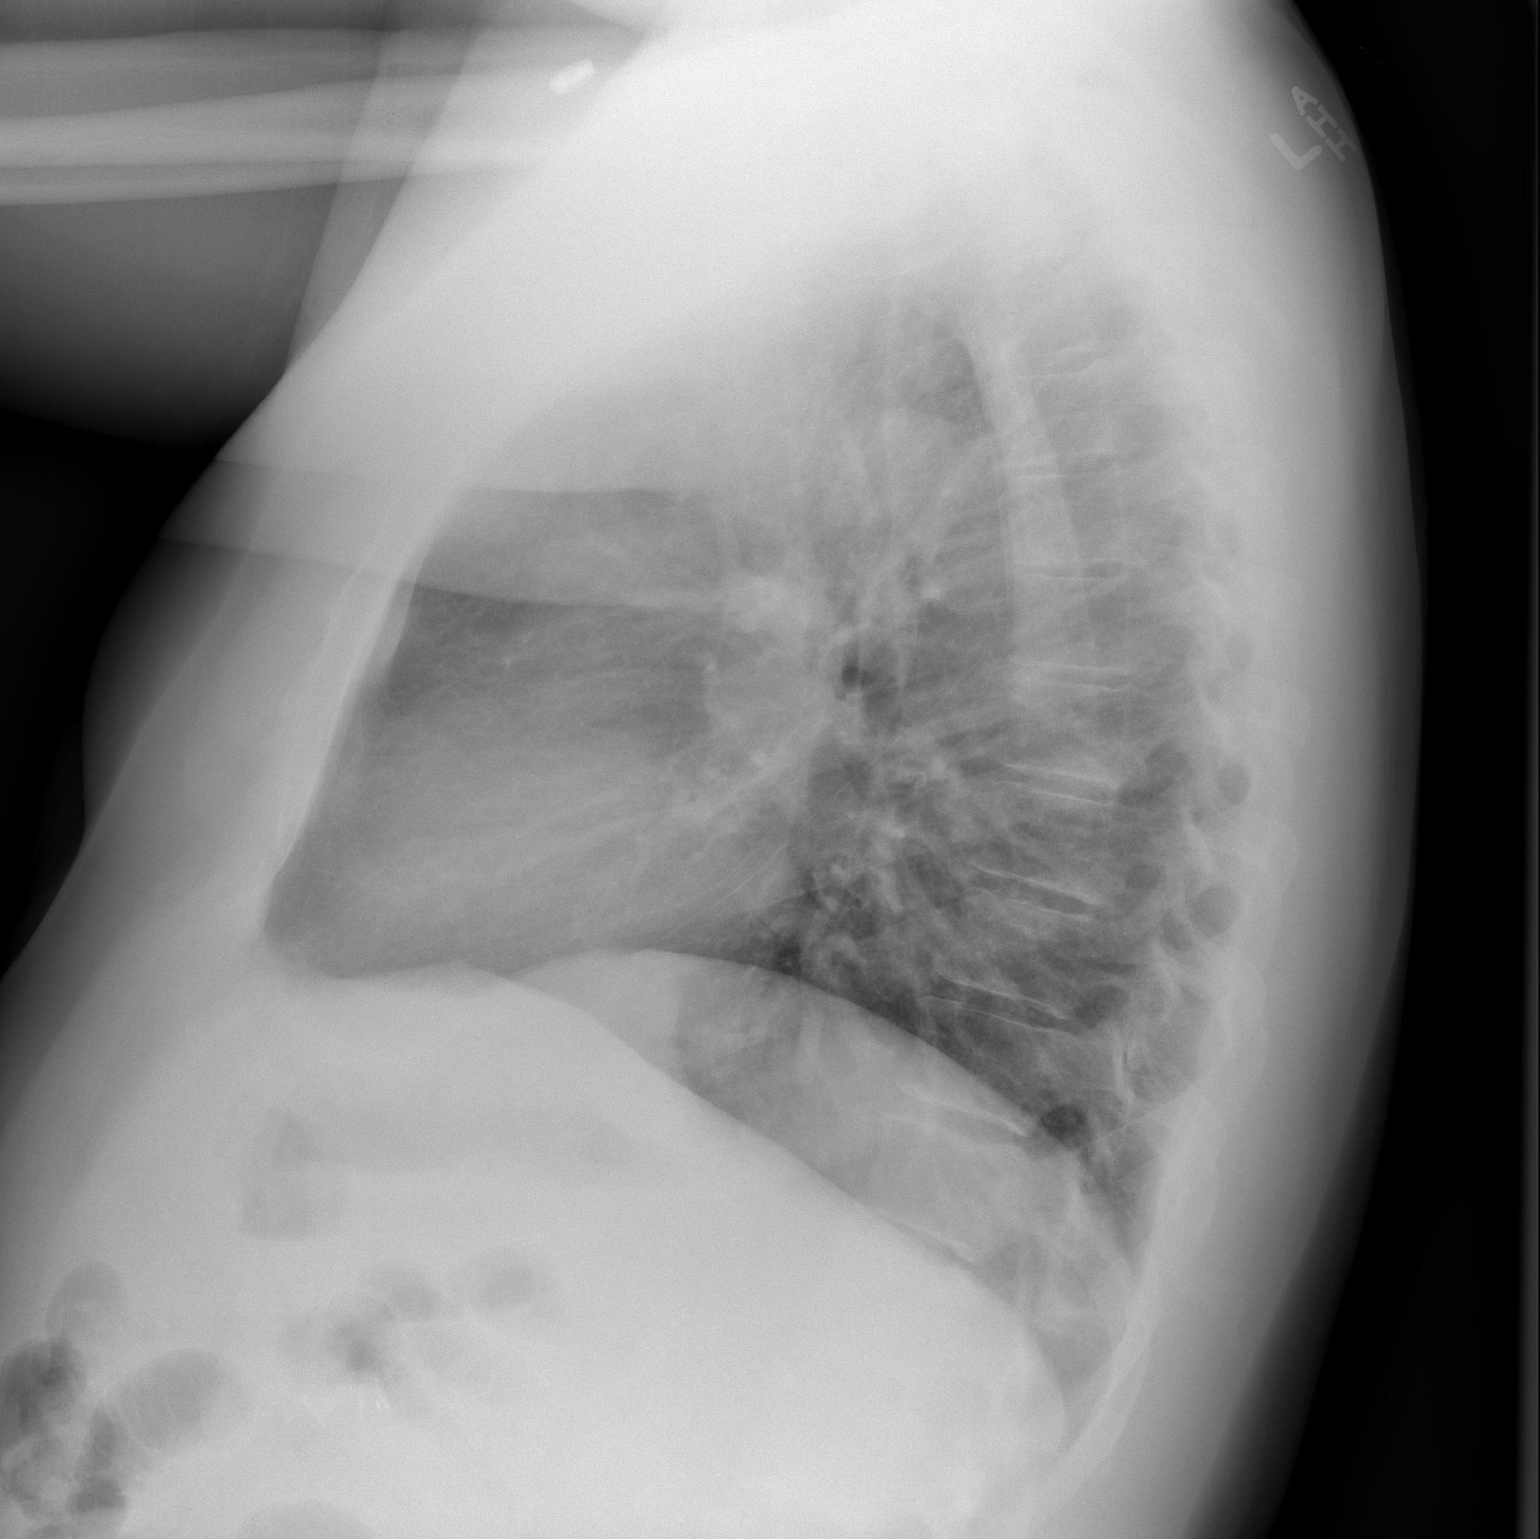

[2 of 2 positions shown; findings below may reference images not displayed]

FINDINGS: The heart size is normal. Lungs are clear. The visualized soft
tissues and bony thorax are unremarkable.
IMPRESSION: Negative two view chest.

## 2016-04-25 ENCOUNTER — Ambulatory Visit (INDEPENDENT_AMBULATORY_CARE_PROVIDER_SITE_OTHER): Payer: Medicaid Other | Admitting: Otolaryngology

## 2016-04-25 DIAGNOSIS — R490 Dysphonia: Secondary | ICD-10-CM

## 2016-04-25 DIAGNOSIS — R040 Epistaxis: Secondary | ICD-10-CM | POA: Diagnosis not present

## 2016-06-20 ENCOUNTER — Ambulatory Visit (INDEPENDENT_AMBULATORY_CARE_PROVIDER_SITE_OTHER): Payer: Self-pay | Admitting: Otolaryngology

## 2016-07-21 ENCOUNTER — Ambulatory Visit (INDEPENDENT_AMBULATORY_CARE_PROVIDER_SITE_OTHER): Payer: Medicaid Other | Admitting: Otolaryngology

## 2016-07-21 DIAGNOSIS — K219 Gastro-esophageal reflux disease without esophagitis: Secondary | ICD-10-CM

## 2016-07-21 DIAGNOSIS — R070 Pain in throat: Secondary | ICD-10-CM

## 2016-10-06 ENCOUNTER — Ambulatory Visit (INDEPENDENT_AMBULATORY_CARE_PROVIDER_SITE_OTHER): Payer: Medicaid Other | Admitting: Otolaryngology

## 2016-10-06 DIAGNOSIS — R490 Dysphonia: Secondary | ICD-10-CM

## 2016-10-06 DIAGNOSIS — K219 Gastro-esophageal reflux disease without esophagitis: Secondary | ICD-10-CM

## 2016-11-01 ENCOUNTER — Other Ambulatory Visit: Payer: Self-pay

## 2016-11-01 ENCOUNTER — Telehealth: Payer: Self-pay | Admitting: Gastroenterology

## 2016-11-01 DIAGNOSIS — Z1211 Encounter for screening for malignant neoplasm of colon: Secondary | ICD-10-CM

## 2016-11-01 MED ORDER — NA SULFATE-K SULFATE-MG SULF 17.5-3.13-1.6 GM/177ML PO SOLN: 1.0000 | ORAL | 0 refills | Status: DC

## 2016-11-01 NOTE — Addendum Note (Signed)
Addended by: Marlou Porch on: 11/01/2016 12:02 PM   Modules accepted: Orders

## 2016-11-01 NOTE — Telephone Encounter (Signed)
Pt is set up for TCS on 11/28/16 @ 1:45 pm. He is aware and instructions are in the mail.

## 2016-11-01 NOTE — Telephone Encounter (Signed)
NO PA is needed for TCS 

## 2016-11-01 NOTE — Telephone Encounter (Signed)
TRIAGE FOR SCREENING TCS.

## 2016-11-01 NOTE — Telephone Encounter (Signed)

## 2016-11-01 NOTE — Telephone Encounter (Signed)
Tried to call with no answer  

## 2016-11-01 NOTE — Telephone Encounter (Signed)
Gastroenterology Pre-Procedure Review  Request Date: Requesting Physician: DR.SHAW  FIRST TCS  PATIENT REVIEW QUESTIONS: The patient responded to the following health history questions as indicated:    1. Diabetes Melitis: NO 2. Joint replacements in the past 12 months: NO 3. Major health problems in the past 3 months: NO 4. Has an artificial valve or MVP: NO 5. Has a defibrillator: NO 6. Has been advised in past to take antibiotics in advance of a procedure like teeth cleaning: NO 7. Family history of colon cancer: NO 8. Alcohol Use: NO 9. History of sleep apnea: NO 10. History of coronary artery or other vascular stents placed within the last 12 months: NO 11. History of any prior anesthesia complications: NO    MEDICATIONS & ALLERGIES:    Patient reports the following regarding taking any blood thinners:   Plavix? NO Aspirin? NO Coumadin? NO Brilinta? NO Xarelto? NO Eliquis? NO Pradaxa? NO Savaysa? NO Effient? NO  Patient confirms/reports the following medications:  Current Outpatient Prescriptions  Medication Sig Dispense Refill  . ALPRAZolam (XANAX) 0.5 MG tablet Take 0.5 mg by mouth 2 (two) times daily as needed. For anxiety    . amLODipine (NORVASC) 5 MG tablet Take 5 mg by mouth every morning.    Marland Kitchen atenolol-chlorthalidone (TENORETIC) 50-25 MG per tablet Take 1 tablet by mouth every morning.    Marland Kitchen ibuprofen (ADVIL,MOTRIN) 800 MG tablet Take 800 mg by mouth every 8 (eight) hours as needed.    Marland Kitchen lisinopril (PRINIVIL,ZESTRIL) 40 MG tablet Take 40 mg by mouth every morning.    . zolpidem (AMBIEN) 10 MG tablet Take 10 mg by mouth at bedtime as needed.     No current facility-administered medications for this visit.     Patient confirms/reports the following allergies:  Allergies  Allergen Reactions  . Codeine Nausea Only and Swelling    Reaction=swelling,chest tightness,itching,nausea  . Percocet [Oxycodone-Acetaminophen] Itching, Nausea Only and Swelling   Reaction=chest tightness    No orders of the defined types were placed in this encounter.   AUTHORIZATION INFORMATION Primary Insurance: MEDICAID,  ID #: [id number],  Group #:  Pre-Cert / Auth required:  Pre-Cert / Auth #:    SCHEDULE INFORMATION: Procedure has been scheduled as follows:  Date: , Time:   Location:   This Gastroenterology Pre-Precedure Review Form is being routed to the following provider(s): Barney Drain, MD

## 2016-11-15 ENCOUNTER — Telehealth: Payer: Self-pay

## 2016-11-15 NOTE — Telephone Encounter (Signed)
Pt was seen this morning at [facility] this morning and they would like for him to have a EGD when he has the TCS if possible, they are going to fax over the notes from today. Please advise

## 2016-11-15 NOTE — Telephone Encounter (Signed)
Why do they feel that is necessary? Need to discuss with Dr. Oneida Alar regarding triage vs office visit. He was triaged for colonoscopy. Need reason for EGD.

## 2016-11-16 NOTE — Telephone Encounter (Signed)
Pt is wanting to know if he can just keep the TCS like it is and then come back for the EGD later? Please advise

## 2016-11-16 NOTE — Telephone Encounter (Signed)
Pt is coming into the office on 12/27/16 @ 10:00 to set up TCS/EGD

## 2016-11-16 NOTE — Telephone Encounter (Signed)
If he is not having any problems with anything, keep TCS as is. However, if he is having any GI issues, abdominal pain, etc, he needs to be seen first.

## 2016-11-28 ENCOUNTER — Ambulatory Visit (HOSPITAL_COMMUNITY): Admit: 2016-11-28 | Payer: Medicaid Other | Admitting: Gastroenterology

## 2016-11-28 ENCOUNTER — Encounter (HOSPITAL_COMMUNITY): Payer: Self-pay

## 2016-11-28 SURGERY — COLONOSCOPY
Anesthesia: Moderate Sedation

## 2016-12-23 ENCOUNTER — Ambulatory Visit (INDEPENDENT_AMBULATORY_CARE_PROVIDER_SITE_OTHER): Payer: Medicaid Other | Admitting: Gastroenterology

## 2016-12-23 ENCOUNTER — Encounter: Payer: Self-pay | Admitting: Gastroenterology

## 2016-12-23 DIAGNOSIS — R1011 Right upper quadrant pain: Secondary | ICD-10-CM | POA: Diagnosis not present

## 2016-12-23 DIAGNOSIS — Z1211 Encounter for screening for malignant neoplasm of colon: Secondary | ICD-10-CM | POA: Insufficient documentation

## 2016-12-23 DIAGNOSIS — Z791 Long term (current) use of non-steroidal anti-inflammatories (NSAID): Secondary | ICD-10-CM | POA: Insufficient documentation

## 2016-12-23 NOTE — Patient Instructions (Addendum)
RE-START PROTONIX TODAY THEN TOMORROW TAKE IT 30 MINUTES PRIOR TO BREAKFAST TO PREVENT GASTRITIS/DUODENITIS AND ULCERS.  COMPLETE UPPER ENDOSCOPY AND COLONOSCOPY WITH PROPOFOL IN 2-3 WEEKS. CLEAR OR FULL LIQUID DIET ON DAY PRIOR TO COLONOSCOPY.  FOLLOW UP IN 6 MOS.

## 2016-12-23 NOTE — Assessment & Plan Note (Addendum)
LIKELY DUE TO NSAID INDUCED GASTRITIS/DUODENITIS,. DIFFERENTIAL DIAGNOSIS INCLUDES: H PYLORI GASTRITIS, CONSTIPATION, LESS LIKELY GASTRIC OR PANCREATIC CA   RE-START PROTONIX. DISCUSSED MEDS, BENEFITS, & RISKS. EGD TO SCREEN FOR BARRETT'S ESOPHAGUS. DISCUSSED PROCEDURE, BENEFITS, & RISKS: < 1% chance of medication reaction, bleeding, OR perforation. FOLLOW UP IN 6 MOS.

## 2016-12-23 NOTE — Assessment & Plan Note (Signed)
AVERAGE RISK. NO WARNING SIGNS/SYMPTOMS   TCS IN 2-3 WEEKS WITH PROPOFOL DUE TO POLYPHARMACY.DISCUSSED PROCEDURE, BENEFITS, & RISKS: < 1% chance of medication reaction, bleeding, perforation, or rupture of spleen/liver. SUPREP. CLEARS OR FULL LIQUIDS ON DAY PRIOR TO TCS.

## 2016-12-23 NOTE — Progress Notes (Signed)
Subjective:    Patient ID: [name], male    DOB: [birth year], 51 y.o.   MRN: [id number]  [name], MD  HPI May have sharp but not debilitating RUQ pain(1-2X/ WEEK, LAYING DOWN EASES IT, BETTER: TIME). IBUPROFEN: BID W/O PPI FOR OVER A YEAR. COUPLE YEARS AGO HAD DIARRHEA/RECTAL BLEEDING. LAST SAW RECTAL BLEEDING: COUPLE YEARS AGO. BMs: ONCE EVERY OTHER DAY AND MAY BE QOD. MAY HAVE CONSTIPATION: ROCKY THEN NL. FEVER/CHILLS /BODY ACHES WITH THE FLU. STILL HAS SUPREP BOWEL PREP AT HOME. CHEWS FOOD RIGHT MUCH. THE MORE YOU CHEW THE BETTER IT GOES DOWN. LOST WEIGHT: ~80 LBS INTENTIONALLY. MAY HAVE NAUSEA WITH CONSTIPATION. RARE VOMITS. HEARTBURN: RIGHT MUCH BUT NOT SINCE TAKING OMEPRAZOLE OR PROTONIX OR RANITIDINE. TEOH PUT HIM OFF. DOESN'T TAKE ANY NOW. NEVER HAD TROUBLE WITH SEDATION. HAD MELOXICAM HURT HIS STOMACH. DIARRHEA: 1-2X/MO.  PT DENIES FEVER, CHILLS, nausea, vomiting, melena, diarrhea, CHEST PAIN, SHORTNESS OF BREATH, CHANGE IN BOWEL IN HABITS, problems swallowing, problems with sedation, OR heartburn or indigestion.  Past Medical History:  Diagnosis Date  . Anxiety attack   . Arthritis   . Difficulty sleeping   . Fatigue   . Fatty liver disease, nonalcoholic   . GERD (gastroesophageal reflux disease)   . H/O hypokalemia   . History of kidney stones   . Hypertension   . PONV (postoperative nausea and vomiting)   . Splenomegaly    Past Surgical History:  Procedure Laterality Date  . CHOLECYSTECTOMY     2003  . DENTAL SURGERY    . KNEE ARTHROSCOPY  2011 / 2012   left x2  . LITHOTRIPSY  2011  . PARTIAL KNEE ARTHROPLASTY  11/15/2011     . PARTIAL KNEE ARTHROPLASTY Left 09/04/2013      Allergies  Allergen Reactions  . Codeine Nausea Only and Swelling    Reaction=swelling,chest tightness,itching,nausea  . Percocet [Oxycodone-Acetaminophen] Itching, Nausea Only and Swelling    Reaction=chest tightness   Current Outpatient Prescriptions  Medication Sig Dispense  Refill  . ALPRAZolam (XANAX) 0.5 MG tablet Take 0.5 mg by mouth 2 (two) times daily as needed. For anxiety BID   . amLODipine (NORVASC) 5 MG tablet Take 5 mg by mouth every morning.    . hydrochlorothiazide (MICROZIDE) 12.5 MG capsule Take 12.5 mg by mouth daily. 1/2 tablet    . lisinopril (PRINIVIL,ZESTRIL) 40 MG tablet Take 40 mg by mouth every morning.    . zolpidem (AMBIEN) 10 MG tablet Take 10 mg by mouth at bedtime as needed.    .      . ibuprofen (ADVIL,MOTRIN) 800 MG tablet Take 800 mg by mouth every 8 (eight) hours as needed. BID   .       Family History  Problem Relation Age of Onset  . Coronary artery disease Other   . Aneurysm Sister   . Stroke Sister   . Colon polyps Neg Hx    Social History   Social History  . Marital status: Married    Spouse name: N/A  . Number of children: N/A  . Years of education: N/A   Social History Main Topics  . Smoking status: Current Every Day Smoker    Packs/day: 1.00  . Smokeless tobacco: Never Used  . Alcohol use Yes     Comment: rare  . Drug use: No  . Sexual activity: Not Asked   Review of Systems PER HPI OTHERWISE ALL SYSTEMS ARE NEGATIVE. OCCASIONAL PAIN AND BULGE IN LEFT GROIN THAT  GOES BACK IN.    Objective:   Physical Exam  Constitutional: He is oriented to person, place, and time. He appears well-developed and well-nourished. No distress.  HENT:  Head: Normocephalic and atraumatic.  Mouth/Throat: Oropharynx is clear and moist. No oropharyngeal exudate.  Eyes: Pupils are equal, round, and reactive to light. No scleral icterus.  Neck: Normal range of motion. Neck supple.  Cardiovascular: Normal rate, regular rhythm and normal heart sounds.   Pulmonary/Chest: Effort normal and breath sounds normal. No respiratory distress.  Abdominal: Soft. Bowel sounds are normal. He exhibits no distension. There is tenderness. There is no rebound.  MILD PERI-UMBILICAL AND RUQ TTP   Musculoskeletal: He exhibits no edema.  BRACE ON  LEFT KNEE  Lymphadenopathy:    He has no cervical adenopathy.  Neurological: He is alert and oriented to person, place, and time.  NO  NEW FOCAL DEFICITS  Psychiatric: He has a normal mood and affect.  Vitals reviewed.     Assessment & Plan:

## 2016-12-23 NOTE — Progress Notes (Signed)
ON RECALL  °

## 2016-12-23 NOTE — Progress Notes (Signed)
CC'D TO PCP °

## 2016-12-23 NOTE — Assessment & Plan Note (Signed)
IBUPROFEN BID W/O PPI.  DISCUSSED  BENEFITS, RISKS, AND MANAGEMENT OF CHRONIC NSAID USE. PATIENT VOICED HIS UNDERSTANDING.

## 2016-12-26 ENCOUNTER — Telehealth: Payer: Self-pay

## 2016-12-26 ENCOUNTER — Other Ambulatory Visit: Payer: Self-pay

## 2016-12-26 DIAGNOSIS — Z1211 Encounter for screening for malignant neoplasm of colon: Secondary | ICD-10-CM

## 2016-12-26 DIAGNOSIS — K2270 Barrett's esophagus without dysplasia: Secondary | ICD-10-CM

## 2016-12-26 NOTE — Telephone Encounter (Signed)
Called pt TCS/EGD with Propofol with [facility] scheduled for 01/17/17 at 10:45am. He already has Suprep at home. Instructions mailed. Orders entered.   Tried to call pt back to inform of pre-op appt 01/13/17 at 12:45pm, no answer, LMOVM. Letter mailed with procedure instructions.

## 2016-12-27 ENCOUNTER — Ambulatory Visit: Payer: Self-pay | Admitting: Gastroenterology

## 2017-01-10 NOTE — Patient Instructions (Signed)
[place]  01/10/2017     @PREFPERIOPPHARMACY @   Your procedure is scheduled on  01/17/2017 .  Report to [facility] at  915  A.M.  Call this number if you have problems the morning of surgery:  [phone]   Remember:  Do not eat food or drink liquids after midnight.  Take these medicines the morning of surgery with A SIP OF WATER  Xanax, lisinopril, protonix.   Do not wear jewelry, make-up or nail polish.  Do not wear lotions, powders, or perfumes, or deoderant.  Do not shave 48 hours prior to surgery.  Men may shave face and neck.  Do not bring valuables to the hospital.  [facility] is not responsible for any belongings or valuables.  Contacts, dentures or bridgework may not be worn into surgery.  Leave your suitcase in the car.  After surgery it may be brought to your room.  For patients admitted to the hospital, discharge time will be determined by your treatment team.  Patients discharged the day of surgery will not be allowed to drive home.   Name and phone number of your driver:   family Special instructions:  Follow the diet and prep instructions given to you by Dr Nona Dell office.  Please read over the following fact sheets that you were given. Anesthesia Post-op Instructions and Care and Recovery After Surgery       Esophagogastroduodenoscopy Esophagogastroduodenoscopy (EGD) is a procedure to examine the lining of the esophagus, stomach, and first part of the small intestine (duodenum). This procedure is done to check for problems such as inflammation, bleeding, ulcers, or growths. During this procedure, a long, flexible, lighted tube with a camera attached (endoscope) is inserted down the throat. Tell a health care provider about:  Any allergies you have.  All medicines you are taking, including vitamins, herbs, eye drops, creams, and over-the-counter medicines.  Any problems you or family members have had with anesthetic  medicines.  Any blood disorders you have.  Any surgeries you have had.  Any medical conditions you have.  Whether you are pregnant or may be pregnant. What are the risks? Generally, this is a safe procedure. However, problems may occur, including:  Infection.  Bleeding.  A tear (perforation) in the esophagus, stomach, or duodenum.  Trouble breathing.  Excessive sweating.  Spasms of the larynx.  A slowed heartbeat.  Low blood pressure.  What happens before the procedure?  Follow instructions from your health care provider about eating or drinking restrictions.  Ask your health care provider about: ? Changing or stopping your regular medicines. This is especially important if you are taking diabetes medicines or blood thinners. ? Taking medicines such as aspirin and ibuprofen. These medicines can thin your blood. Do not take these medicines before your procedure if your health care provider instructs you not to.  Plan to have someone take you home after the procedure.  If you wear dentures, be ready to remove them before the procedure. What happens during the procedure?  To reduce your risk of infection, your health care team will wash or sanitize their hands.  An IV tube will be put in a vein in your hand or arm. You will get medicines and fluids through this tube.  You will be given one or more of the following: ? A medicine to help you relax (sedative). ? A medicine to numb the area (local anesthetic).  This medicine may be sprayed into your throat. It will make you feel more comfortable and keep you from gagging or coughing during the procedure. ? A medicine for pain.  A mouth guard may be placed in your mouth to protect your teeth and to keep you from biting on the endoscope.  You will be asked to lie on your left side.  The endoscope will be lowered down your throat into your esophagus, stomach, and duodenum.  Air will be put into the endoscope. This will  help your health care provider see better.  The lining of your esophagus, stomach, and duodenum will be examined.  Your health care provider may: ? Take a tissue sample so it can be looked at in a lab (biopsy). ? Remove growths. ? Remove objects (foreign bodies) that are stuck. ? Treat any bleeding with medicines or other devices that stop tissue from bleeding. ? Widen (dilate) or stretch narrowed areas of your esophagus and stomach.  The endoscope will be taken out. The procedure may vary among health care providers and hospitals. What happens after the procedure?  Your blood pressure, heart rate, breathing rate, and blood oxygen level will be monitored often until the medicines you were given have worn off.  Do not eat or drink anything until the numbing medicine has worn off and your gag reflex has returned. This information is not intended to replace advice given to you by your health care provider. Make sure you discuss any questions you have with your health care provider. Document Released: 07/22/2004 Document Revised: 08/27/2015 Document Reviewed: 02/12/2015 Elsevier Interactive Patient Education  2018 [employer]. Esophagogastroduodenoscopy, Care After Refer to this sheet in the next few weeks. These instructions provide you with information about caring for yourself after your procedure. Your health care provider may also give you more specific instructions. Your treatment has been planned according to current medical practices, but problems sometimes occur. Call your health care provider if you have any problems or questions after your procedure. What can I expect after the procedure? After the procedure, it is common to have:  A sore throat.  Nausea.  Bloating.  Dizziness.  Fatigue.  Follow these instructions at home:  Do not eat or drink anything until the numbing medicine (local anesthetic) has worn off and your gag reflex has returned. You will know that the  local anesthetic has worn off when you can swallow comfortably.  Do not drive for 24 hours if you received a medicine to help you relax (sedative).  If your health care provider took a tissue sample for testing during the procedure, make sure to get your test results. This is your responsibility. Ask your health care provider or the department performing the test when your results will be ready.  Keep all follow-up visits as told by your health care provider. This is important. Contact a health care provider if:  You cannot stop coughing.  You are not urinating.  You are urinating less than usual. Get help right away if:  You have trouble swallowing.  You cannot eat or drink.  You have throat or chest pain that gets worse.  You are dizzy or light-headed.  You faint.  You have nausea or vomiting.  You have chills.  You have a fever.  You have severe abdominal pain.  You have black, tarry, or bloody stools. This information is not intended to replace advice given to you by your health care provider. Make sure you discuss any questions  you have with your health care provider. Document Released: 03/07/2012 Document Revised: 08/27/2015 Document Reviewed: 02/12/2015 Elsevier Interactive Patient Education  2018 [employer].  Colonoscopy, Adult A colonoscopy is an exam to look at the entire large intestine. During the exam, a lubricated, bendable tube is inserted into the anus and then passed into the rectum, colon, and other parts of the large intestine. A colonoscopy is often done as a part of normal colorectal screening or in response to certain symptoms, such as anemia, persistent diarrhea, abdominal pain, and blood in the stool. The exam can help screen for and diagnose medical problems, including:  Tumors.  Polyps.  Inflammation.  Areas of bleeding.  Tell a health care provider about:  Any allergies you have.  All medicines you are taking, including vitamins,  herbs, eye drops, creams, and over-the-counter medicines.  Any problems you or family members have had with anesthetic medicines.  Any blood disorders you have.  Any surgeries you have had.  Any medical conditions you have.  Any problems you have had passing stool. What are the risks? Generally, this is a safe procedure. However, problems may occur, including:  Bleeding.  A tear in the intestine.  A reaction to medicines given during the exam.  Infection (rare).  What happens before the procedure? Eating and drinking restrictions Follow instructions from your health care provider about eating and drinking, which may include:  A few days before the procedure - follow a low-fiber diet. Avoid nuts, seeds, dried fruit, raw fruits, and vegetables.  1-3 days before the procedure - follow a clear liquid diet. Drink only clear liquids, such as clear broth or bouillon, black coffee or tea, clear juice, clear soft drinks or sports drinks, gelatin dessert, and popsicles. Avoid any liquids that contain red or purple dye.  On the day of the procedure - do not eat or drink anything during the 2 hours before the procedure, or within the time period that your health care provider recommends.  Bowel prep If you were prescribed an oral bowel prep to clean out your colon:  Take it as told by your health care provider. Starting the day before your procedure, you will need to drink a large amount of medicated liquid. The liquid will cause you to have multiple loose stools until your stool is almost clear or light green.  If your skin or anus gets irritated from diarrhea, you may use these to relieve the irritation: ? Medicated wipes, such as adult wet wipes with aloe and vitamin E. ? A skin soothing-product like petroleum jelly.  If you vomit while drinking the bowel prep, take a break for up to 60 minutes and then begin the bowel prep again. If vomiting continues and you cannot take the bowel  prep without vomiting, call your health care provider.  General instructions  Ask your health care provider about changing or stopping your regular medicines. This is especially important if you are taking diabetes medicines or blood thinners.  Plan to have someone take you home from the hospital or clinic. What happens during the procedure?  An IV tube may be inserted into one of your veins.  You will be given medicine to help you relax (sedative).  To reduce your risk of infection: ? Your health care team will wash or sanitize their hands. ? Your anal area will be washed with soap.  You will be asked to lie on your side with your knees bent.  Your health care provider will  lubricate a long, thin, flexible tube. The tube will have a camera and a light on the end.  The tube will be inserted into your anus.  The tube will be gently eased through your rectum and colon.  Air will be delivered into your colon to keep it open. You may feel some pressure or cramping.  The camera will be used to take images during the procedure.  A small tissue sample may be removed from your body to be examined under a microscope (biopsy). If any potential problems are found, the tissue will be sent to a lab for testing.  If small polyps are found, your health care provider may remove them and have them checked for cancer cells.  The tube that was inserted into your anus will be slowly removed. The procedure may vary among health care providers and hospitals. What happens after the procedure?  Your blood pressure, heart rate, breathing rate, and blood oxygen level will be monitored until the medicines you were given have worn off.  Do not drive for 24 hours after the exam.  You may have a small amount of blood in your stool.  You may pass gas and have mild abdominal cramping or bloating due to the air that was used to inflate your colon during the exam.  It is up to you to get the results of  your procedure. Ask your health care provider, or the department performing the procedure, when your results will be ready. This information is not intended to replace advice given to you by your health care provider. Make sure you discuss any questions you have with your health care provider. Document Released: 03/18/2000 Document Revised: 01/20/2016 Document Reviewed: 06/02/2015 Elsevier Interactive Patient Education  2018 [employer].  Colonoscopy, Adult, Care After This sheet gives you information about how to care for yourself after your procedure. Your health care provider may also give you more specific instructions. If you have problems or questions, contact your health care provider. What can I expect after the procedure? After the procedure, it is common to have:  A small amount of blood in your stool for 24 hours after the procedure.  Some gas.  Mild abdominal cramping or bloating.  Follow these instructions at home: General instructions   For the first 24 hours after the procedure: ? Do not drive or use machinery. ? Do not sign important documents. ? Do not drink alcohol. ? Do your regular daily activities at a slower pace than normal. ? Eat soft, easy-to-digest foods. ? Rest often.  Take over-the-counter or prescription medicines only as told by your health care provider.  It is up to you to get the results of your procedure. Ask your health care provider, or the department performing the procedure, when your results will be ready. Relieving cramping and bloating  Try walking around when you have cramps or feel bloated.  Apply heat to your abdomen as told by your health care provider. Use a heat source that your health care provider recommends, such as a moist heat pack or a heating pad. ? Place a towel between your skin and the heat source. ? Leave the heat on for 20-30 minutes. ? Remove the heat if your skin turns bright red. This is especially important if you  are unable to feel pain, heat, or cold. You may have a greater risk of getting burned. Eating and drinking  Drink enough fluid to keep your urine clear or pale yellow.  Resume your  normal diet as instructed by your health care provider. Avoid heavy or fried foods that are hard to digest.  Avoid drinking alcohol for as long as instructed by your health care provider. Contact a health care provider if:  You have blood in your stool 2-3 days after the procedure. Get help right away if:  You have more than a small spotting of blood in your stool.  You pass large blood clots in your stool.  Your abdomen is swollen.  You have nausea or vomiting.  You have a fever.  You have increasing abdominal pain that is not relieved with medicine. This information is not intended to replace advice given to you by your health care provider. Make sure you discuss any questions you have with your health care provider. Document Released: 11/03/2003 Document Revised: 12/14/2015 Document Reviewed: 06/02/2015 Elsevier Interactive Patient Education  2018 [employer] Anesthesia is a term that refers to techniques, procedures, and medicines that help a person stay safe and comfortable during a medical procedure. Monitored anesthesia care, or sedation, is one type of anesthesia. Your anesthesia specialist may recommend sedation if you will be having a procedure that does not require you to be unconscious, such as:  Cataract surgery.  A dental procedure.  A biopsy.  A colonoscopy.  During the procedure, you may receive a medicine to help you relax (sedative). There are three levels of sedation:  Mild sedation. At this level, you may feel awake and relaxed. You will be able to follow directions.  Moderate sedation. At this level, you will be sleepy. You may not remember the procedure.  Deep sedation. At this level, you will be asleep. You will not remember the  procedure.  The more medicine you are given, the deeper your level of sedation will be. Depending on how you respond to the procedure, the anesthesia specialist may change your level of sedation or the type of anesthesia to fit your needs. An anesthesia specialist will monitor you closely during the procedure. Let your health care provider know about:  Any allergies you have.  All medicines you are taking, including vitamins, herbs, eye drops, creams, and over-the-counter medicines.  Any use of steroids (by mouth or as a cream).  Any problems you or family members have had with sedatives and anesthetic medicines.  Any blood disorders you have.  Any surgeries you have had.  Any medical conditions you have, such as sleep apnea.  Whether you are pregnant or may be pregnant.  Any use of cigarettes, alcohol, or street drugs. What are the risks? Generally, this is a safe procedure. However, problems may occur, including:  Getting too much medicine (oversedation).  Nausea.  Allergic reaction to medicines.  Trouble breathing. If this happens, a breathing tube may be used to help with breathing. It will be removed when you are awake and breathing on your own.  Heart trouble.  Lung trouble.  Before the procedure Staying hydrated Follow instructions from your health care provider about hydration, which may include:  Up to 2 hours before the procedure - you may continue to drink clear liquids, such as water, clear fruit juice, black coffee, and plain tea.  Eating and drinking restrictions Follow instructions from your health care provider about eating and drinking, which may include:  8 hours before the procedure - stop eating heavy meals or foods such as meat, fried foods, or fatty foods.  6 hours before the procedure - stop eating light meals or  foods, such as toast or cereal.  6 hours before the procedure - stop drinking milk or drinks that contain milk.  2 hours before the  procedure - stop drinking clear liquids.  Medicines Ask your health care provider about:  Changing or stopping your regular medicines. This is especially important if you are taking diabetes medicines or blood thinners.  Taking medicines such as aspirin and ibuprofen. These medicines can thin your blood. Do not take these medicines before your procedure if your health care provider instructs you not to.  Tests and exams  You will have a physical exam.  You may have blood tests done to show: ? How well your kidneys and liver are working. ? How well your blood can clot.  General instructions  Plan to have someone take you home from the hospital or clinic.  If you will be going home right after the procedure, plan to have someone with you for 24 hours.  What happens during the procedure?  Your blood pressure, heart rate, breathing, level of pain and overall condition will be monitored.  An IV tube will be inserted into one of your veins.  Your anesthesia specialist will give you medicines as needed to keep you comfortable during the procedure. This may mean changing the level of sedation.  The procedure will be performed. After the procedure  Your blood pressure, heart rate, breathing rate, and blood oxygen level will be monitored until the medicines you were given have worn off.  Do not drive for 24 hours if you received a sedative.  You may: ? Feel sleepy, clumsy, or nauseous. ? Feel forgetful about what happened after the procedure. ? Have a sore throat if you had a breathing tube during the procedure. ? Vomit. This information is not intended to replace advice given to you by your health care provider. Make sure you discuss any questions you have with your health care provider. Document Released: 12/15/2004 Document Revised: 08/28/2015 Document Reviewed: 07/12/2015 Elsevier Interactive Patient Education  2018 [employer], Care After These  instructions provide you with information about caring for yourself after your procedure. Your health care provider may also give you more specific instructions. Your treatment has been planned according to current medical practices, but problems sometimes occur. Call your health care provider if you have any problems or questions after your procedure. What can I expect after the procedure? After your procedure, it is common to:  Feel sleepy for several hours.  Feel clumsy and have poor balance for several hours.  Feel forgetful about what happened after the procedure.  Have poor judgment for several hours.  Feel nauseous or vomit.  Have a sore throat if you had a breathing tube during the procedure.  Follow these instructions at home: For at least 24 hours after the procedure:   Do not: ? Participate in activities in which you could fall or become injured. ? Drive. ? Use heavy machinery. ? Drink alcohol. ? Take sleeping pills or medicines that cause drowsiness. ? Make important decisions or sign legal documents. ? Take care of children on your own.  Rest. Eating and drinking  Follow the diet that is recommended by your health care provider.  If you vomit, drink water, juice, or soup when you can drink without vomiting.  Make sure you have little or no nausea before eating solid foods. General instructions  Have a responsible adult stay with you until you are awake and alert.  Take over-the-counter  and prescription medicines only as told by your health care provider.  If you smoke, do not smoke without supervision.  Keep all follow-up visits as told by your health care provider. This is important. Contact a health care provider if:  You keep feeling nauseous or you keep vomiting.  You feel light-headed.  You develop a rash.  You have a fever. Get help right away if:  You have trouble breathing. This information is not intended to replace advice given to you  by your health care provider. Make sure you discuss any questions you have with your health care provider. Document Released: 07/12/2015 Document Revised: 11/11/2015 Document Reviewed: 07/12/2015 Elsevier Interactive Patient Education  [employer].

## 2017-01-13 ENCOUNTER — Other Ambulatory Visit: Payer: Self-pay

## 2017-01-13 ENCOUNTER — Encounter (HOSPITAL_COMMUNITY)
Admission: RE | Admit: 2017-01-13 | Discharge: 2017-01-13 | Disposition: A | Discharge status: Home / Self Care | Payer: Medicaid Other | Source: Ambulatory Visit | Attending: Gastroenterology | Admitting: Gastroenterology

## 2017-01-13 ENCOUNTER — Encounter (HOSPITAL_COMMUNITY): Payer: Self-pay

## 2017-01-13 DIAGNOSIS — Z01812 Encounter for preprocedural laboratory examination: Secondary | ICD-10-CM | POA: Insufficient documentation

## 2017-01-13 DIAGNOSIS — R1011 Right upper quadrant pain: Secondary | ICD-10-CM | POA: Insufficient documentation

## 2017-01-13 DIAGNOSIS — R001 Bradycardia, unspecified: Secondary | ICD-10-CM | POA: Insufficient documentation

## 2017-01-13 DIAGNOSIS — Z01810 Encounter for preprocedural cardiovascular examination: Secondary | ICD-10-CM | POA: Diagnosis present

## 2017-01-13 LAB — BASIC METABOLIC PANEL
ANION GAP: 12 (ref 5–15)
BUN: 25 mg/dL — AB (ref 6–20)
CALCIUM: 9.3 mg/dL (ref 8.9–10.3)
CO2: 28 mmol/L (ref 22–32)
CREATININE: 0.78 mg/dL (ref 0.61–1.24)
Chloride: 101 mmol/L (ref 101–111)
GFR calc Af Amer: >60 mL/min (ref >60)
GLUCOSE: 80 mg/dL (ref 65–99)
Potassium: 3.4 mmol/L — ABNORMAL LOW (ref 3.5–5.1)
Sodium: 141 mmol/L (ref 135–145)

## 2017-01-13 LAB — CBC WITH DIFFERENTIAL/PLATELET
BASOS ABS: 0 10*3/uL (ref 0.0–0.1)
Basophils Relative: 1 %
EOS PCT: 4 %
Eosinophils Absolute: 0.2 10*3/uL (ref 0.0–0.7)
HCT: 46.7 % (ref 39.0–52.0)
Hemoglobin: 16.4 g/dL (ref 13.0–17.0)
LYMPHS PCT: 33 %
Lymphs Abs: 2 10*3/uL (ref 0.7–4.0)
MCH: 30.3 pg (ref 26.0–34.0)
MCHC: 35.1 g/dL (ref 30.0–36.0)
MCV: 86.3 fL (ref 78.0–100.0)
MONO ABS: 0.6 10*3/uL (ref 0.1–1.0)
MONOS PCT: 10 %
Neutro Abs: 3.2 10*3/uL (ref 1.7–7.7)
Neutrophils Relative %: 52 %
PLATELETS: 189 10*3/uL (ref 150–400)
RBC: 5.41 MIL/uL (ref 4.22–5.81)
RDW: 12.6 % (ref 11.5–15.5)
WBC: 6 10*3/uL (ref 4.0–10.5)

## 2017-01-17 ENCOUNTER — Ambulatory Visit (HOSPITAL_COMMUNITY): Payer: Medicaid Other | Admitting: Anesthesiology

## 2017-01-17 ENCOUNTER — Ambulatory Visit (HOSPITAL_COMMUNITY)
Admission: RE | Admit: 2017-01-17 | Discharge: 2017-01-17 | Disposition: A | Discharge status: Home / Self Care | Payer: Medicaid Other | Source: Ambulatory Visit | Attending: Gastroenterology | Admitting: Gastroenterology

## 2017-01-17 ENCOUNTER — Encounter (HOSPITAL_COMMUNITY)
Admission: RE | Disposition: A | Discharge status: Home / Self Care | Payer: Self-pay | Source: Ambulatory Visit | Attending: Gastroenterology

## 2017-01-17 ENCOUNTER — Encounter (HOSPITAL_COMMUNITY): Payer: Self-pay | Admitting: *Deleted

## 2017-01-17 DIAGNOSIS — K2270 Barrett's esophagus without dysplasia: Secondary | ICD-10-CM | POA: Diagnosis not present

## 2017-01-17 DIAGNOSIS — K2970 Gastritis, unspecified, without bleeding: Secondary | ICD-10-CM | POA: Diagnosis not present

## 2017-01-17 DIAGNOSIS — K648 Other hemorrhoids: Secondary | ICD-10-CM | POA: Diagnosis not present

## 2017-01-17 DIAGNOSIS — Z87442 Personal history of urinary calculi: Secondary | ICD-10-CM | POA: Insufficient documentation

## 2017-01-17 DIAGNOSIS — K760 Fatty (change of) liver, not elsewhere classified: Secondary | ICD-10-CM | POA: Diagnosis not present

## 2017-01-17 DIAGNOSIS — K2960 Other gastritis without bleeding: Secondary | ICD-10-CM | POA: Diagnosis not present

## 2017-01-17 DIAGNOSIS — Z8371 Family history of colonic polyps: Secondary | ICD-10-CM | POA: Insufficient documentation

## 2017-01-17 DIAGNOSIS — I10 Essential (primary) hypertension: Secondary | ICD-10-CM | POA: Diagnosis not present

## 2017-01-17 DIAGNOSIS — Z79899 Other long term (current) drug therapy: Secondary | ICD-10-CM | POA: Insufficient documentation

## 2017-01-17 DIAGNOSIS — F17210 Nicotine dependence, cigarettes, uncomplicated: Secondary | ICD-10-CM | POA: Insufficient documentation

## 2017-01-17 DIAGNOSIS — D125 Benign neoplasm of sigmoid colon: Secondary | ICD-10-CM

## 2017-01-17 DIAGNOSIS — D120 Benign neoplasm of cecum: Secondary | ICD-10-CM

## 2017-01-17 DIAGNOSIS — K219 Gastro-esophageal reflux disease without esophagitis: Secondary | ICD-10-CM | POA: Insufficient documentation

## 2017-01-17 DIAGNOSIS — T39395A Adverse effect of other nonsteroidal anti-inflammatory drugs [NSAID], initial encounter: Secondary | ICD-10-CM

## 2017-01-17 DIAGNOSIS — Z1211 Encounter for screening for malignant neoplasm of colon: Secondary | ICD-10-CM | POA: Insufficient documentation

## 2017-01-17 DIAGNOSIS — K635 Polyp of colon: Secondary | ICD-10-CM

## 2017-01-17 DIAGNOSIS — Z13810 Encounter for screening for upper gastrointestinal disorder: Secondary | ICD-10-CM

## 2017-01-17 DIAGNOSIS — Q438 Other specified congenital malformations of intestine: Secondary | ICD-10-CM | POA: Insufficient documentation

## 2017-01-17 DIAGNOSIS — K2950 Unspecified chronic gastritis without bleeding: Secondary | ICD-10-CM | POA: Diagnosis not present

## 2017-01-17 HISTORY — PX: ESOPHAGOGASTRODUODENOSCOPY (EGD) WITH PROPOFOL: SHX5813

## 2017-01-17 HISTORY — PX: POLYPECTOMY: SHX5525

## 2017-01-17 HISTORY — PX: COLONOSCOPY WITH PROPOFOL: SHX5780

## 2017-01-17 SURGERY — COLONOSCOPY WITH PROPOFOL
Anesthesia: Monitor Anesthesia Care

## 2017-01-17 MED ORDER — GLYCOPYRROLATE 0.2 MG/ML IJ SOLN
0.2000 mg | Freq: Once | INTRAMUSCULAR | Status: AC | PRN
  Administered 2017-01-17: 0.2 mg via INTRAVENOUS

## 2017-01-17 MED ORDER — GLYCOPYRROLATE 0.2 MG/ML IJ SOLN
INTRAMUSCULAR | Status: AC
  Filled 2017-01-17: qty 1

## 2017-01-17 MED ORDER — STERILE WATER FOR IRRIGATION IR SOLN
Status: DC | PRN
  Administered 2017-01-17: 2.5 mL
  Administered 2017-01-17: 10:00:00

## 2017-01-17 MED ORDER — FENTANYL CITRATE (PF) 100 MCG/2ML IJ SOLN
25.0000 ug | Freq: Once | INTRAMUSCULAR | Status: AC
Start: 2017-01-17 — End: 2017-01-17
  Administered 2017-01-17: 25 ug via INTRAVENOUS

## 2017-01-17 MED ORDER — LIDOCAINE VISCOUS 2 % MT SOLN
5.0000 mL | Freq: Once | OROMUCOSAL | Status: AC
  Administered 2017-01-17: 5 mL via OROMUCOSAL

## 2017-01-17 MED ORDER — PROPOFOL 10 MG/ML IV BOLUS
INTRAVENOUS | Status: AC
  Filled 2017-01-17: qty 20

## 2017-01-17 MED ORDER — LACTATED RINGERS IV SOLN
INTRAVENOUS | Status: DC
  Administered 2017-01-17: 10:00:00 via INTRAVENOUS

## 2017-01-17 MED ORDER — ONDANSETRON HCL 4 MG/2ML IJ SOLN
INTRAMUSCULAR | Status: AC
  Filled 2017-01-17: qty 2

## 2017-01-17 MED ORDER — MIDAZOLAM HCL 2 MG/2ML IJ SOLN
INTRAMUSCULAR | Status: AC
  Filled 2017-01-17: qty 2

## 2017-01-17 MED ORDER — PROPOFOL 500 MG/50ML IV EMUL
INTRAVENOUS | Status: DC | PRN
  Administered 2017-01-17 (×2): 115 ug/kg/min via INTRAVENOUS
  Administered 2017-01-17: 125 ug/kg/min via INTRAVENOUS

## 2017-01-17 MED ORDER — ONDANSETRON HCL 4 MG/2ML IJ SOLN
4.0000 mg | Freq: Once | INTRAMUSCULAR | Status: AC
  Administered 2017-01-17: 4 mg via INTRAVENOUS

## 2017-01-17 MED ORDER — FENTANYL CITRATE (PF) 100 MCG/2ML IJ SOLN
INTRAMUSCULAR | Status: AC
  Filled 2017-01-17: qty 2

## 2017-01-17 MED ORDER — LIDOCAINE VISCOUS 2 % MT SOLN
OROMUCOSAL | Status: AC
  Filled 2017-01-17: qty 15

## 2017-01-17 MED ORDER — PROPOFOL 10 MG/ML IV BOLUS
INTRAVENOUS | Status: DC | PRN
  Administered 2017-01-17 (×3): 20 mg via INTRAVENOUS

## 2017-01-17 MED ORDER — MIDAZOLAM HCL 2 MG/2ML IJ SOLN
1.0000 mg | INTRAMUSCULAR | Status: AC
Start: 2017-01-17 — End: 2017-01-17
  Administered 2017-01-17: 2 mg via INTRAVENOUS

## 2017-01-17 NOTE — H&P (View-Only) (Signed)
Subjective:    Patient ID: [name], male    DOB: [birth year], 51 y.o.   MRN: [id number]  [name], MD  HPI May have sharp but not debilitating RUQ pain(1-2X/ WEEK, LAYING DOWN EASES IT, BETTER: TIME). IBUPROFEN: BID W/O PPI FOR OVER A YEAR. COUPLE YEARS AGO HAD DIARRHEA/RECTAL BLEEDING. LAST SAW RECTAL BLEEDING: COUPLE YEARS AGO. BMs: ONCE EVERY OTHER DAY AND MAY BE QOD. MAY HAVE CONSTIPATION: ROCKY THEN NL. FEVER/CHILLS /BODY ACHES WITH THE FLU. STILL HAS SUPREP BOWEL PREP AT HOME. CHEWS FOOD RIGHT MUCH. THE MORE YOU CHEW THE BETTER IT GOES DOWN. LOST WEIGHT: ~80 LBS INTENTIONALLY. MAY HAVE NAUSEA WITH CONSTIPATION. RARE VOMITS. HEARTBURN: RIGHT MUCH BUT NOT SINCE TAKING OMEPRAZOLE OR PROTONIX OR RANITIDINE. TEOH PUT HIM OFF. DOESN'T TAKE ANY NOW. NEVER HAD TROUBLE WITH SEDATION. HAD MELOXICAM HURT HIS STOMACH. DIARRHEA: 1-2X/MO.  PT DENIES FEVER, CHILLS, nausea, vomiting, melena, diarrhea, CHEST PAIN, SHORTNESS OF BREATH, CHANGE IN BOWEL IN HABITS, problems swallowing, problems with sedation, OR heartburn or indigestion.  Past Medical History:  Diagnosis Date  . Anxiety attack   . Arthritis   . Difficulty sleeping   . Fatigue   . Fatty liver disease, nonalcoholic   . GERD (gastroesophageal reflux disease)   . H/O hypokalemia   . History of kidney stones   . Hypertension   . PONV (postoperative nausea and vomiting)   . Splenomegaly    Past Surgical History:  Procedure Laterality Date  . CHOLECYSTECTOMY     2003  . DENTAL SURGERY    . KNEE ARTHROSCOPY  2011 / 2012   left x2  . LITHOTRIPSY  2011  . PARTIAL KNEE ARTHROPLASTY  11/15/2011     . PARTIAL KNEE ARTHROPLASTY Left 09/04/2013      Allergies  Allergen Reactions  . Codeine Nausea Only and Swelling    Reaction=swelling,chest tightness,itching,nausea  . Percocet [Oxycodone-Acetaminophen] Itching, Nausea Only and Swelling    Reaction=chest tightness   Current Outpatient Prescriptions  Medication Sig Dispense  Refill  . ALPRAZolam (XANAX) 0.5 MG tablet Take 0.5 mg by mouth 2 (two) times daily as needed. For anxiety BID   . amLODipine (NORVASC) 5 MG tablet Take 5 mg by mouth every morning.    . hydrochlorothiazide (MICROZIDE) 12.5 MG capsule Take 12.5 mg by mouth daily. 1/2 tablet    . lisinopril (PRINIVIL,ZESTRIL) 40 MG tablet Take 40 mg by mouth every morning.    . zolpidem (AMBIEN) 10 MG tablet Take 10 mg by mouth at bedtime as needed.    .      . ibuprofen (ADVIL,MOTRIN) 800 MG tablet Take 800 mg by mouth every 8 (eight) hours as needed. BID   .       Family History  Problem Relation Age of Onset  . Coronary artery disease Other   . Aneurysm Sister   . Stroke Sister   . Colon polyps Neg Hx    Social History   Social History  . Marital status: Married    Spouse name: N/A  . Number of children: N/A  . Years of education: N/A   Social History Main Topics  . Smoking status: Current Every Day Smoker    Packs/day: 1.00  . Smokeless tobacco: Never Used  . Alcohol use Yes     Comment: rare  . Drug use: No  . Sexual activity: Not Asked   Review of Systems PER HPI OTHERWISE ALL SYSTEMS ARE NEGATIVE. OCCASIONAL PAIN AND BULGE IN LEFT GROIN THAT  GOES BACK IN.    Objective:   Physical Exam  Constitutional: He is oriented to person, place, and time. He appears well-developed and well-nourished. No distress.  HENT:  Head: Normocephalic and atraumatic.  Mouth/Throat: Oropharynx is clear and moist. No oropharyngeal exudate.  Eyes: Pupils are equal, round, and reactive to light. No scleral icterus.  Neck: Normal range of motion. Neck supple.  Cardiovascular: Normal rate, regular rhythm and normal heart sounds.   Pulmonary/Chest: Effort normal and breath sounds normal. No respiratory distress.  Abdominal: Soft. Bowel sounds are normal. He exhibits no distension. There is tenderness. There is no rebound.  MILD PERI-UMBILICAL AND RUQ TTP   Musculoskeletal: He exhibits no edema.  BRACE ON  LEFT KNEE  Lymphadenopathy:    He has no cervical adenopathy.  Neurological: He is alert and oriented to person, place, and time.  NO  NEW FOCAL DEFICITS  Psychiatric: He has a normal mood and affect.  Vitals reviewed.     Assessment & Plan:

## 2017-01-17 NOTE — Anesthesia Preprocedure Evaluation (Signed)
Anesthesia Evaluation  Patient identified by MRN, date of birth, ID band Patient awake    Reviewed: Allergy & Precautions, H&P , NPO status , Patient's Chart, lab work & pertinent test results  History of Anesthesia Complications (+) PONV  Airway Mallampati: II  TM Distance: >3 FB Neck ROM: Full    Dental  (+) Teeth Intact, Dental Advisory Given   Pulmonary Current Smoker,    Pulmonary exam normal breath sounds clear to auscultation       Cardiovascular hypertension, Pt. on medications Normal cardiovascular exam Rhythm:Regular Rate:Normal     Neuro/Psych negative neurological ROS  negative psych ROS   GI/Hepatic negative GI ROS, GERD  ,Fatty liver disease, nonalcoholic   Endo/Other  negative endocrine ROS  Renal/GU negative Renal ROS  negative genitourinary   Musculoskeletal negative musculoskeletal ROS (+)   Abdominal   Peds  Hematology negative hematology ROS (+)   Anesthesia Other Findings   Reproductive/Obstetrics                             Anesthesia Physical Anesthesia Plan  ASA: II  Anesthesia Plan: MAC   Post-op Pain Management:    Induction: Intravenous  PONV Risk Score and Plan:   Airway Management Planned: Simple Face Mask  Additional Equipment:   Intra-op Plan:   Post-operative Plan:   Informed Consent: I have reviewed the patients History and Physical, chart, labs and discussed the procedure including the risks, benefits and alternatives for the proposed anesthesia with the patient or authorized representative who has indicated his/her understanding and acceptance.     Plan Discussed with:   Anesthesia Plan Comments:         Anesthesia Quick Evaluation

## 2017-01-17 NOTE — Anesthesia Postprocedure Evaluation (Signed)
Anesthesia Post Note  Patient: Patsy Varma Pluta  Procedure(s) Performed: COLONOSCOPY WITH PROPOFOL (N/A ) ESOPHAGOGASTRODUODENOSCOPY (EGD) WITH PROPOFOL (N/A ) POLYPECTOMY  Patient location during evaluation: PACU Anesthesia Type: MAC Level of consciousness: awake and alert Pain management: satisfactory to patient Vital Signs Assessment: post-procedure vital signs reviewed and stable Respiratory status: spontaneous breathing Cardiovascular status: stable Postop Assessment: no apparent nausea or vomiting Anesthetic complications: no     Last Vitals:  Vitals:   01/17/17 1100 01/17/17 1120  BP: 103/68 101/67  Pulse: (!) 52 (!) 52  Resp: 15 16  Temp: 36.6 C (!) 36.4 C  SpO2: 100% 98%    Last Pain:  Vitals:   01/17/17 1120  TempSrc: Oral  PainSc:                  Drucie Opitz

## 2017-01-17 NOTE — Discharge Instructions (Signed)
You have internal hemorrhoids, and HAD 2 polyps removed. You have mild gastritis. I biopsied your stomach.   AVOID ITEMS THAT TRIGGER GASTRITIS.  FOLLOW A HIGH FIBER/LOW FAT DIET. AVOID ITEMS THAT CAUSE BLOATING. SEE INFO BELOW.  YOUR BIOPSY RESULTS WILL BE AVAILABLE IN MY CHART AFTER OCT 20 AND MY OFFICE WILL CONTACT YOU IN 10-14 DAYS WITH YOUR RESULTS.   Next colonoscopy in 5-10 years.   ENDOSCOPY Care After Read the instructions outlined below and refer to this sheet in the next week. These discharge instructions provide you with general information on caring for yourself after you leave the hospital. While your treatment has been planned according to the most current medical practices available, unavoidable complications occasionally occur. If you have any problems or questions after discharge, call DR. FIELDS, [phone].  ACTIVITY  You may resume your regular activity, but move at a slower pace for the next 24 hours.   Take frequent rest periods for the next 24 hours.   Walking will help get rid of the air and reduce the bloated feeling in your belly (abdomen).   No driving for 24 hours (because of the medicine (anesthesia) used during the test).   You may shower.   Do not sign any important legal documents or operate any machinery for 24 hours (because of the anesthesia used during the test).    NUTRITION  Drink plenty of fluids.   You may resume your normal diet as instructed by your doctor.   Begin with a light meal and progress to your normal diet. Heavy or fried foods are harder to digest and may make you feel sick to your stomach (nauseated).   Avoid alcoholic beverages for 24 hours or as instructed.    MEDICATIONS  You may resume your normal medications.   WHAT YOU CAN EXPECT TODAY  Some feelings of bloating in the abdomen.   Passage of more gas than usual.   Spotting of blood in your stool or on the toilet paper  .  IF YOU HAD POLYPS REMOVED  DURING THE ENDOSCOPY:  Eat a soft diet IF YOU HAVE NAUSEA, BLOATING, ABDOMINAL PAIN, OR VOMITING.    FINDING OUT THE RESULTS OF YOUR TEST Not all test results are available during your visit. DR. Oneida Alar WILL CALL YOU WITHIN 14 DAYS OF YOUR PROCEDUE WITH YOUR RESULTS. Do not assume everything is normal if you have not heard from DR. FIELDS, CALL HER OFFICE AT [phone].  SEEK IMMEDIATE MEDICAL ATTENTION AND CALL THE OFFICE: [phone] IF:  You have more than a spotting of blood in your stool.   Your belly is swollen (abdominal distention).   You are nauseated or vomiting.   You have a temperature over 101F.   You have abdominal pain or discomfort that is severe or gets worse throughout the day.  Polyps, Colon  A polyp is extra tissue that grows inside your body. Colon polyps grow in the large intestine. The large intestine, also called the colon, is part of your digestive system. It is a long, hollow tube at the end of your digestive tract where your body makes and stores stool. Most polyps are not dangerous. They are benign. This means they are not cancerous. But over time, some types of polyps can turn into cancer. Polyps that are smaller than a pea are usually not harmful. But larger polyps could someday become or may already be cancerous. To be safe, doctors remove all polyps and test them.   WHO GETS  POLYPS? Anyone can get polyps, but certain people are more likely than others. You may have a greater chance of getting polyps if:  You are over 50.   You have had polyps before.   Someone in your family has had polyps.   Someone in your family has had cancer of the large intestine.   Find out if someone in your family has had polyps. You may also be more likely to get polyps if you:   Eat a lot of fatty foods   Smoke   Drink alcohol   Do not exercise  Eat too much   PREVENTION There is not one sure way to prevent polyps. You might be able to lower your risk of  getting them if you:  Eat more fruits and vegetables and less fatty food.   Do not smoke.   Avoid alcohol.   Exercise every day.   Lose weight if you are overweight.   Eating more calcium and folate can also lower your risk of getting polyps. Some foods that are rich in calcium are milk, cheese, and broccoli. Some foods that are rich in folate are chickpeas, kidney beans, and spinach.    Gastritis  Gastritis is an inflammation (the body's way of reacting to injury and/or infection) of the stomach. It is often caused by viral or bacterial (germ) infections. It can also be caused BY ASPIRIN, BC/GOODY POWDER'S, (IBUPROFEN) MOTRIN, OR ALEVE (NAPROXEN), chemicals (including alcohol), SPICY FOODS, and medications. This illness may be associated with generalized malaise (feeling tired, not well), UPPER ABDOMINAL STOMACH cramps, and fever. One common bacterial cause of gastritis is an organism known as H. Pylori. This can be treated with antibiotics.    High-Fiber Diet A high-fiber diet changes your normal diet to include more whole grains, legumes, fruits, and vegetables. Changes in the diet involve replacing refined carbohydrates with unrefined foods. The calorie level of the diet is essentially unchanged. The Dietary Reference Intake (recommended amount) for adult males is 38 grams per day. For adult females, it is 25 grams per day. Pregnant and lactating women should consume 28 grams of fiber per day. Fiber is the intact part of a plant that is not broken down during digestion. Functional fiber is fiber that has been isolated from the plant to provide a beneficial effect in the body. PURPOSE  Increase stool bulk.   Ease and regulate bowel movements.   Lower cholesterol.   REDUCE RISK OF COLON CANCER  INDICATIONS THAT YOU NEED MORE FIBER  Constipation and hemorrhoids.   Uncomplicated diverticulosis (intestine condition) and irritable bowel syndrome.   Weight management.   As a  protective measure against hardening of the arteries (atherosclerosis), diabetes, and cancer.   GUIDELINES FOR INCREASING FIBER IN THE DIET  Start adding fiber to the diet slowly. A gradual increase of about 5 more grams (2 slices of whole-wheat bread, 2 servings of most fruits or vegetables, or 1 bowl of high-fiber cereal) per day is best. Too rapid an increase in fiber may result in constipation, flatulence, and bloating.   Drink enough water and fluids to keep your urine clear or pale yellow. Water, juice, or caffeine-free drinks are recommended. Not drinking enough fluid may cause constipation.   Eat a variety of high-fiber foods rather than one type of fiber.   Try to increase your intake of fiber through using high-fiber foods rather than fiber pills or supplements that contain small amounts of fiber.   The goal is to change  the types of food eaten. Do not supplement your present diet with high-fiber foods, but replace foods in your present diet.   INCLUDE A VARIETY OF FIBER SOURCES  Replace refined and processed grains with whole grains, canned fruits with fresh fruits, and incorporate other fiber sources. White rice, white breads, and most bakery goods contain little or no fiber.   Brown whole-grain rice, buckwheat oats, and many fruits and vegetables are all good sources of fiber. These include: broccoli, Brussels sprouts, cabbage, cauliflower, beets, sweet potatoes, white potatoes (skin on), carrots, tomatoes, eggplant, squash, berries, fresh fruits, and dried fruits.   Cereals appear to be the richest source of fiber. Cereal fiber is found in whole grains and bran. Bran is the fiber-rich outer coat of cereal grain, which is largely removed in refining. In whole-grain cereals, the bran remains. In breakfast cereals, the largest amount of fiber is found in those with "bran" in their names. The fiber content is sometimes indicated on the label.   You may need to include additional  fruits and vegetables each day.   In baking, for 1 cup white flour, you may use the following substitutions:   1 cup whole-wheat flour minus 2 tablespoons.   1/2 cup white flour plus 1/2 cup whole-wheat flour.   Low-Fat Diet BREADS, CEREALS, PASTA, RICE, DRIED PEAS, AND BEANS These products are high in carbohydrates and most are low in fat. Therefore, they can be increased in the diet as substitutes for fatty foods. They too, however, contain calories and should not be eaten in excess. Cereals can be eaten for snacks as well as for breakfast.  Include foods that contain fiber (fruits, vegetables, whole grains, and legumes). Research shows that fiber may lower blood cholesterol levels, especially the water-soluble fiber found in fruits, vegetables, oat products, and legumes. FRUITS AND VEGETABLES It is good to eat fruits and vegetables. Besides being sources of fiber, both are rich in vitamins and some minerals. They help you get the daily allowances of these nutrients. Fruits and vegetables can be used for snacks and desserts. MEATS Limit lean meat, chicken, [place], and fish to no more than 6 ounces per day. Beef, Pork, and Lamb Use lean cuts of beef, pork, and lamb. Lean cuts include:  Extra-lean ground beef.  Arm roast.  Sirloin tip.  Center-cut ham.  Round steak.  Loin chops.  Rump roast.  Tenderloin.  Trim all fat off the outside of meats before cooking. It is not necessary to severely decrease the intake of red meat, but lean choices should be made. Lean meat is rich in protein and contains a highly absorbable form of iron. Premenopausal women, in particular, should avoid reducing lean red meat because this could increase the risk for low red blood cells (iron-deficiency anemia). The organ meats, such as liver, sweetbreads, kidneys, and brain are very rich in cholesterol. They should be limited. Chicken and [place] These are good sources of protein. The fat of poultry can be reduced  by removing the skin and underlying fat layers before cooking. Chicken and [place] can be substituted for lean red meat in the diet. Poultry should not be fried or covered with high-fat sauces. Fish and Shellfish Fish is a good source of protein. Shellfish contain cholesterol, but they usually are low in saturated fatty acids. The preparation of fish is important. Like chicken and [place], they should not be fried or covered with high-fat sauces. EGGS Egg whites contain no fat or cholesterol. They can be eaten  often. Try 1 to 2 egg whites instead of whole eggs in recipes or use egg substitutes that do not contain yolk. MILK AND DAIRY PRODUCTS Use skim or 1% milk instead of 2% or whole milk. Decrease whole milk, natural, and processed cheeses. Use nonfat or low-fat (2%) cottage cheese or low-fat cheeses made from vegetable oils. Choose nonfat or low-fat (1 to 2%) yogurt. Experiment with evaporated skim milk in recipes that call for heavy cream. Substitute low-fat yogurt or low-fat cottage cheese for sour cream in dips and salad dressings. Have at least 2 servings of low-fat dairy products, such as 2 glasses of skim (or 1%) milk each day to help get your daily calcium intake.  FATS AND OILS Reduce the total intake of fats, especially saturated fat. Butterfat, lard, and beef fats are high in saturated fat and cholesterol. These should be avoided as much as possible. Vegetable fats do not contain cholesterol, but certain vegetable fats, such as coconut oil, palm oil, and palm kernel oil are very high in saturated fats. These should be limited. These fats are often used in bakery goods, processed foods, popcorn, oils, and nondairy creamers. Vegetable shortenings and some peanut butters contain hydrogenated oils, which are also saturated fats. Read the labels on these foods and check for saturated vegetable oils. Unsaturated vegetable oils and fats do not raise blood cholesterol. However, they should be limited  because they are fats and are high in calories. Total fat should still be limited to 30% of your daily caloric intake. Desirable liquid vegetable oils are corn oil, cottonseed oil, olive oil, canola oil, safflower oil, soybean oil, and sunflower oil. Peanut oil is not as good, but small amounts are acceptable. Buy a heart-healthy tub margarine that has no partially hydrogenated oils in the ingredients. Mayonnaise and salad dressings often are made from unsaturated fats, but they should also be limited because of their high calorie and fat content. Seeds, nuts, peanut butter, olives, and avocados are high in fat, but the fat is mainly the unsaturated type. These foods should be limited mainly to avoid excess calories and fat. OTHER EATING TIPS Snacks  Most sweets should be limited as snacks. They tend to be rich in calories and fats, and their caloric content outweighs their nutritional value. Some good choices in snacks are graham crackers, melba toast, soda crackers, bagels (no egg), English muffins, fruits, and vegetables. These snacks are preferable to snack crackers, [place] fries, and chips. Popcorn should be air-popped or cooked in small amounts of liquid vegetable oil. Desserts Eat fruit, low-fat yogurt, and fruit ices. AVOID pastries, cake, and cookies. Sherbet, angel food cake, gelatin dessert, frozen low-fat yogurt, or other frozen products that do not contain saturated fat (pure fruit juice bars, frozen ice pops) are also acceptable.  COOKING METHODS Choose those methods that use little or no fat. They include: Poaching.  Braising.  Steaming.  Grilling.  Baking.  Stir-frying.  Broiling.  Microwaving.  Foods can be cooked in a nonstick pan without added fat, or use a nonfat cooking spray in regular cookware. Limit fried foods and avoid frying in saturated fat. Add moisture to lean meats by using water, broth, cooking wines, and other nonfat or low-fat sauces along with the cooking methods  mentioned above. Soups and stews should be chilled after cooking. The fat that forms on top after a few hours in the refrigerator should be skimmed off. When preparing meals, avoid using excess salt. Salt can contribute to raising blood pressure  in some people. EATING AWAY FROM HOME Order entres, potatoes, and vegetables without sauces or butter. When meat exceeds the size of a deck of cards (3 to 4 ounces), the rest can be taken home for another meal. Choose vegetable or fruit salads and ask for low-calorie salad dressings to be served on the side. Use dressings sparingly. Limit high-fat toppings, such as bacon, crumbled eggs, cheese, sunflower seeds, and olives. Ask for heart-healthy tub margarine instead of butter.   Hemorrhoids Hemorrhoids are dilated (enlarged) veins around the rectum. Sometimes clots will form in the veins. This makes them swollen and painful. These are called thrombosed hemorrhoids. Causes of hemorrhoids include:  Constipation.   Straining to have a bowel movement.   HEAVY LIFTING HOME CARE INSTRUCTIONS  Eat a well balanced diet and drink 6 to 8 glasses of water every day to avoid constipation. You may also use a bulk laxative.   Avoid straining to have bowel movements.   Keep anal area dry and clean.   Do not use a donut shaped pillow or sit on the toilet for long periods. This increases blood pooling and pain.   Move your bowels when your body has the urge; this will require less straining and will decrease pain and pressure.

## 2017-01-17 NOTE — Op Note (Signed)
[facility] Patient Name: [name] Procedure Date: 01/17/2017 10:42 AM MRN: [id number] Date of Birth: [birth year] Attending MD: Barney Drain MD, MD CSN: [id number] Age: 51 Admit Type: Outpatient Procedure:                Upper GI endoscopy WITH COLD FORCEPS BIOPSY Indications:              Screening for Barrett's esophagus Providers:                Barney Drain MD, MD, Janeece Riggers, RN, Selena Lesser, Rosina Lowenstein, RN Referring MD:             [name] MD, MD Medicines:                Propofol per Anesthesia Complications:            No immediate complications. Estimated Blood Loss:     Estimated blood loss was minimal. Procedure:                Pre-Anesthesia Assessment:                           - Prior to the procedure, a History and Physical                            was performed, and patient medications and                            allergies were reviewed. The patient's tolerance of                            previous anesthesia was also reviewed. The risks                            and benefits of the procedure and the sedation                            options and risks were discussed with the patient.                            All questions were answered, and informed consent                            was obtained. Prior Anticoagulants: The patient has                            taken ibuprofen, last dose was 1 day prior to                            procedure. ASA Grade Assessment: II - A patient                            with mild systemic disease. After reviewing the  risks and benefits, the patient was deemed in                            satisfactory condition to undergo the procedure.                            After obtaining informed consent, the endoscope was                            passed under direct vision. Throughout the                            procedure, the patient's blood  pressure, pulse, and                            oxygen saturations were monitored continuously. The                            [id number] ([id number]) scope was introduced through the                            mouth, and advanced to the second part of duodenum.                            The upper GI endoscopy was accomplished without                            difficulty. The patient tolerated the procedure                            well. Scope In: 10:44:38 AM Scope Out: 10:51:17 AM Total Procedure Duration: 0 hours 6 minutes 39 seconds  Findings:      The examined esophagus was normal.      Patchy mild inflammation characterized by congestion (edema) and       erythema was found in the gastric antrum. Biopsies were taken with a       cold forceps for Helicobacter pylori testing.      The examined duodenum was normal. Impression:               - Normal esophagus. NO BARRETT'S ESOPHAGUS                           - MILD Gastritis DUE TO IBUPROFEN Moderate Sedation:      Per Anesthesia Care Recommendation:           - Await pathology results.                           - High fiber diet and low fat diet.                           - Continue present medications.                           - Return to my office in 6 months.                           -  Patient has a contact number available for                            emergencies. The signs and symptoms of potential                            delayed complications were discussed with the                            patient. Return to normal activities tomorrow.                            Written discharge instructions were provided to the                            patient. Procedure Code(s):        --- Professional ---                           [phone], Esophagogastroduodenoscopy, flexible,                            transoral; with biopsy, single or multiple Diagnosis Code(s):        --- Professional ---                           K29.70,  Gastritis, unspecified, without bleeding                           Z13.810, Encounter for screening for upper                            gastrointestinal disorder CPT copyright 2016 [employer]. All rights reserved. The codes documented in this report are preliminary and upon coder review may  be revised to meet current compliance requirements. Barney Drain, MD Barney Drain MD, MD 01/17/2017 11:07:31 AM This report has been signed electronically. Number of Addenda: 0

## 2017-01-17 NOTE — Anesthesia Procedure Notes (Signed)
Procedure Name: MAC Date/Time: 01/17/2017 10:01 AM Performed by: Vista Deck Pre-anesthesia Checklist: Patient identified, Emergency Drugs available, Suction available, Timeout performed and Patient being monitored Patient Re-evaluated:Patient Re-evaluated prior to induction Oxygen Delivery Method: Non-rebreather mask

## 2017-01-17 NOTE — Op Note (Signed)
[facility] Patient Name: [name] Procedure Date: 01/17/2017 9:50 AM MRN: [id number] Date of Birth: 18-Jun-1965 Attending MD: Barney Drain MD, MD CSN: [id number] Age: 51 Admit Type: Outpatient Procedure:                Colonoscopy WITH SNARE CAUTERY/COLD SNARE                            POLYPECTOMY Indications:              Screening for colorectal malignant neoplasm Providers:                Barney Drain MD, MD, Janeece Riggers, RN, Selena Lesser, Rosina Lowenstein, RN Referring MD:             [name] MD, MD Medicines:                Propofol per Anesthesia Complications:            No immediate complications. Estimated Blood Loss:     Estimated blood loss was minimal. Procedure:                Pre-Anesthesia Assessment:                           - Prior to the procedure, a History and Physical                            was performed, and patient medications and                            allergies were reviewed. The patient's tolerance of                            previous anesthesia was also reviewed. The risks                            and benefits of the procedure and the sedation                            options and risks were discussed with the patient.                            All questions were answered, and informed consent                            was obtained. Prior Anticoagulants: The patient has                            taken ibuprofen, last dose was 1 day prior to                            procedure. ASA Grade Assessment: II - A patient  with mild systemic disease. After reviewing the                            risks and benefits, the patient was deemed in                            satisfactory condition to undergo the procedure.                            After obtaining informed consent, the colonoscope                            was passed under direct vision. Throughout the             procedure, the patient's blood pressure, pulse, and                            oxygen saturations were monitored continuously. The                            [id number] ([id number]) scope was introduced through                            the anus and advanced to the the cecum, identified                            by appendiceal orifice and ileocecal valve. The                            colonoscopy was somewhat difficult due to a                            tortuous colon. Successful completion of the                            procedure was aided by COLOWRAP. The patient                            tolerated the procedure well. The quality of the                            bowel preparation was excellent. The ileocecal                            valve, appendiceal orifice, and rectum were                            photographed. Scope In: 10:18:54 AM Scope Out: 10:40:21 AM Scope Withdrawal Time: 0 hours 17 minutes 54 seconds  Total Procedure Duration: 0 hours 21 minutes 27 seconds  Findings:      A 3 mm polyp was found in the cecum(BTL #1). The polyp was sessile. The       polyp was removed with a cold snare. Resection and retrieval were       complete.  A 6 mm polyp was found in the sigmoid colon. The polyp was sessile. The       polyp was removed with a hot snare. Resection and retrieval were       complete.      Internal hemorrhoids were found during retroflexion. The hemorrhoids       were small.      The recto-sigmoid colon and sigmoid colon were moderately redundant. Impression:               - One 3 mm polyp in the cecum, removed with a cold                            snare. Resected and retrieved.                           - One 6 mm polyp in the sigmoid colon, removed with                            a hot snare. Resected and retrieved.                           - Internal hemorrhoids.                           - Redundant LEFT colon. Moderate Sedation:      Per  Anesthesia Care Recommendation:           - High fiber diet and low fat diet.                           - Await pathology results.                           - Repeat colonoscopy in 5-10 years for surveillance.                           - Continue present medications.                           - Patient has a contact number available for                            emergencies. The signs and symptoms of potential                            delayed complications were discussed with the                            patient. Return to normal activities tomorrow.                            Written discharge instructions were provided to the                            patient. Procedure Code(s):        --- Professional ---  45385, Colonoscopy, flexible; with removal of                            tumor(s), polyp(s), or other lesion(s) by snare                            technique Diagnosis Code(s):        --- Professional ---                           Z12.11, Encounter for screening for malignant                            neoplasm of colon                           D12.0, Benign neoplasm of cecum                           D12.5, Benign neoplasm of sigmoid colon                           K64.8, Other hemorrhoids                           Q43.8, Other specified congenital malformations of                            intestine CPT copyright 2016 American Medical Association. All rights reserved. The codes documented in this report are preliminary and upon coder review may  be revised to meet current compliance requirements. Barney Drain, MD Barney Drain MD, MD 01/17/2017 11:03:19 AM This report has been signed electronically. Number of Addenda: 0

## 2017-01-17 NOTE — Transfer of Care (Signed)
Immediate Anesthesia Transfer of Care Note  Patient: Hamlin Devine Shanks  Procedure(s) Performed: COLONOSCOPY WITH PROPOFOL (N/A ) ESOPHAGOGASTRODUODENOSCOPY (EGD) WITH PROPOFOL (N/A ) POLYPECTOMY  Patient Location: PACU  Anesthesia Type:MAC  Level of Consciousness: awake and patient cooperative  Airway & Oxygen Therapy: Patient Spontanous Breathing and Patient connected to nasal cannula oxygen  Post-op Assessment: Report given to RN and Post -op Vital signs reviewed and stable  Post vital signs: Reviewed and stable  Last Vitals:  Vitals:   01/17/17 0955 01/17/17 1000  BP:  116/89  Pulse:    Resp: 14 14  Temp:    SpO2: 97% 98%    Last Pain:  Vitals:   01/17/17 1005  TempSrc:   PainSc: 2       Patients Stated Pain Goal: 3 (09/25/74 2831)  Complications: No apparent anesthesia complications

## 2017-01-17 NOTE — Interval H&P Note (Signed)
History and Physical Interval Note:  01/17/2017 9:45 AM  [name]  has presented today for surgery, with the diagnosis of colon cancer screening, Barrett's screening  The various methods of treatment have been discussed with the patient and family. After consideration of risks, benefits and other options for treatment, the patient has consented to  Procedure(s) with comments: COLONOSCOPY WITH PROPOFOL (N/A) - 10:45am ESOPHAGOGASTRODUODENOSCOPY (EGD) WITH PROPOFOL (N/A) as a surgical intervention .  The patient's history has been reviewed, patient examined, no change in status, stable for surgery.  I have reviewed the patient's chart and labs.  Questions were answered to the patient's satisfaction.     [employer]

## 2017-01-20 ENCOUNTER — Other Ambulatory Visit: Payer: Self-pay

## 2017-01-20 DIAGNOSIS — E876 Hypokalemia: Secondary | ICD-10-CM

## 2017-01-20 NOTE — Progress Notes (Signed)
CC'D TO PCP °

## 2017-01-23 ENCOUNTER — Other Ambulatory Visit (HOSPITAL_COMMUNITY)
Admission: RE | Admit: 2017-01-23 | Discharge: 2017-01-23 | Disposition: A | Discharge status: Home / Self Care | Payer: Medicaid Other | Source: Ambulatory Visit | Attending: Gastroenterology | Admitting: Gastroenterology

## 2017-01-23 DIAGNOSIS — E876 Hypokalemia: Secondary | ICD-10-CM | POA: Insufficient documentation

## 2017-01-23 LAB — BASIC METABOLIC PANEL
Anion gap: 9 (ref 5–15)
BUN: 23 mg/dL — ABNORMAL HIGH (ref 6–20)
CHLORIDE: 101 mmol/L (ref 101–111)
CO2: 30 mmol/L (ref 22–32)
CREATININE: 0.82 mg/dL (ref 0.61–1.24)
Calcium: 9.8 mg/dL (ref 8.9–10.3)
GFR calc non Af Amer: >60 mL/min (ref >60)
GLUCOSE: 95 mg/dL (ref 65–99)
Potassium: 3.5 mmol/L (ref 3.5–5.1)
Sodium: 140 mmol/L (ref 135–145)

## 2017-01-24 ENCOUNTER — Encounter (HOSPITAL_COMMUNITY): Payer: Self-pay | Admitting: Gastroenterology

## 2017-01-26 ENCOUNTER — Telehealth: Payer: Self-pay | Admitting: Gastroenterology

## 2017-01-26 NOTE — Telephone Encounter (Signed)
Patient made aware of results and recommendations 

## 2017-01-26 NOTE — Telephone Encounter (Signed)
Please call pt. 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 FOLLOW A HIGH FIBER DIET. NEXT TCS IN 5 YEARS. His stomach Bx shows mild gastritis.    AVOID ITEMS THAT TRIGGER GASTRITIS. FOLLOW A HIGH FIBER/LOW FAT DIET. AVOID ITEMS THAT CAUSE BLOATING.  CONTINUE PROTONIX. TAKE 30 MINUTES PRIOR TO BREAKFAST. FOLLOW UP IN APR 2018 E30 RUQ ABDOMINAL PAIN/GERD.

## 2017-01-26 NOTE — Telephone Encounter (Signed)
ON RECALL FOR OFFICE VISIT

## 2017-05-31 ENCOUNTER — Encounter: Payer: Self-pay | Admitting: Gastroenterology

## 2017-09-21 ENCOUNTER — Ambulatory Visit (INDEPENDENT_AMBULATORY_CARE_PROVIDER_SITE_OTHER): Payer: Medicaid Other | Admitting: Otolaryngology

## 2017-09-21 DIAGNOSIS — R490 Dysphonia: Secondary | ICD-10-CM | POA: Diagnosis not present

## 2017-09-21 DIAGNOSIS — K219 Gastro-esophageal reflux disease without esophagitis: Secondary | ICD-10-CM | POA: Diagnosis not present

## 2020-07-03 ENCOUNTER — Other Ambulatory Visit: Payer: Self-pay

## 2020-07-03 ENCOUNTER — Observation Stay (HOSPITAL_COMMUNITY): Payer: 59

## 2020-07-03 ENCOUNTER — Encounter (HOSPITAL_COMMUNITY): Payer: Self-pay | Admitting: *Deleted

## 2020-07-03 ENCOUNTER — Inpatient Hospital Stay (HOSPITAL_COMMUNITY)
Admission: EM | Admit: 2020-07-03 | Discharge: 2020-07-05 | DRG: 683 | Disposition: A | Discharge status: Home / Self Care | Payer: 59 | Attending: Family Medicine | Admitting: Family Medicine

## 2020-07-03 DIAGNOSIS — N179 Acute kidney failure, unspecified: Principal | ICD-10-CM | POA: Diagnosis present

## 2020-07-03 DIAGNOSIS — F411 Generalized anxiety disorder: Secondary | ICD-10-CM | POA: Diagnosis present

## 2020-07-03 DIAGNOSIS — Z79899 Other long term (current) drug therapy: Secondary | ICD-10-CM

## 2020-07-03 DIAGNOSIS — A080 Rotaviral enteritis: Secondary | ICD-10-CM | POA: Diagnosis present

## 2020-07-03 DIAGNOSIS — R197 Diarrhea, unspecified: Secondary | ICD-10-CM

## 2020-07-03 DIAGNOSIS — K219 Gastro-esophageal reflux disease without esophagitis: Secondary | ICD-10-CM | POA: Diagnosis present

## 2020-07-03 DIAGNOSIS — D582 Other hemoglobinopathies: Secondary | ICD-10-CM

## 2020-07-03 DIAGNOSIS — F17210 Nicotine dependence, cigarettes, uncomplicated: Secondary | ICD-10-CM | POA: Diagnosis present

## 2020-07-03 DIAGNOSIS — R109 Unspecified abdominal pain: Secondary | ICD-10-CM

## 2020-07-03 DIAGNOSIS — F419 Anxiety disorder, unspecified: Secondary | ICD-10-CM | POA: Diagnosis present

## 2020-07-03 DIAGNOSIS — E860 Dehydration: Secondary | ICD-10-CM | POA: Diagnosis present

## 2020-07-03 DIAGNOSIS — I10 Essential (primary) hypertension: Secondary | ICD-10-CM | POA: Diagnosis present

## 2020-07-03 DIAGNOSIS — E876 Hypokalemia: Secondary | ICD-10-CM | POA: Diagnosis present

## 2020-07-03 DIAGNOSIS — R112 Nausea with vomiting, unspecified: Secondary | ICD-10-CM

## 2020-07-03 DIAGNOSIS — Z28310 Unvaccinated for covid-19: Secondary | ICD-10-CM

## 2020-07-03 DIAGNOSIS — K760 Fatty (change of) liver, not elsewhere classified: Secondary | ICD-10-CM | POA: Diagnosis present

## 2020-07-03 DIAGNOSIS — Z20822 Contact with and (suspected) exposure to covid-19: Secondary | ICD-10-CM | POA: Diagnosis present

## 2020-07-03 LAB — RESP PANEL BY RT-PCR (FLU A&B, COVID) ARPGX2
Influenza A by PCR: NEGATIVE
Influenza B by PCR: NEGATIVE
SARS Coronavirus 2 by RT PCR: NEGATIVE

## 2020-07-03 LAB — MAGNESIUM: Magnesium: 2.4 mg/dL (ref 1.7–2.4)

## 2020-07-03 LAB — CBC WITH DIFFERENTIAL/PLATELET
Abs Immature Granulocytes: 0.05 10*3/uL (ref 0.00–0.07)
Basophils Absolute: 0 10*3/uL (ref 0.0–0.1)
Basophils Relative: 0 %
Eosinophils Absolute: 0 10*3/uL (ref 0.0–0.5)
Eosinophils Relative: 0 %
HCT: 62 % — ABNORMAL HIGH (ref 39.0–52.0)
Hemoglobin: 21.4 g/dL (ref 13.0–17.0)
Immature Granulocytes: 1 %
Lymphocytes Relative: 3 %
Lymphs Abs: 0.3 10*3/uL — ABNORMAL LOW (ref 0.7–4.0)
MCH: 30.1 pg (ref 26.0–34.0)
MCHC: 34.5 g/dL (ref 30.0–36.0)
MCV: 87.2 fL (ref 80.0–100.0)
Monocytes Absolute: 1 10*3/uL (ref 0.1–1.0)
Monocytes Relative: 9 %
Neutro Abs: 9.4 10*3/uL — ABNORMAL HIGH (ref 1.7–7.7)
Neutrophils Relative %: 87 %
Platelets: 239 10*3/uL (ref 150–400)
RBC: 7.11 MIL/uL — ABNORMAL HIGH (ref 4.22–5.81)
RDW: 13.8 % (ref 11.5–15.5)
WBC: 10.8 10*3/uL — ABNORMAL HIGH (ref 4.0–10.5)
nRBC: 0 % (ref 0.0–0.2)

## 2020-07-03 LAB — LIPASE, BLOOD: Lipase: 27 U/L (ref 11–51)

## 2020-07-03 LAB — COMPREHENSIVE METABOLIC PANEL
ALT: 74 U/L — ABNORMAL HIGH (ref 0–44)
AST: 37 U/L (ref 15–41)
Albumin: 5.8 g/dL — ABNORMAL HIGH (ref 3.5–5.0)
Alkaline Phosphatase: 107 U/L (ref 38–126)
Anion gap: 15 (ref 5–15)
BUN: 38 mg/dL — ABNORMAL HIGH (ref 6–20)
CO2: 22 mmol/L (ref 22–32)
Calcium: 10 mg/dL (ref 8.9–10.3)
Chloride: 101 mmol/L (ref 98–111)
Creatinine, Ser: 2.11 mg/dL — ABNORMAL HIGH (ref 0.61–1.24)
GFR, Estimated: 37 mL/min — ABNORMAL LOW (ref >60)
Glucose, Bld: 137 mg/dL — ABNORMAL HIGH (ref 70–99)
Potassium: 3.3 mmol/L — ABNORMAL LOW (ref 3.5–5.1)
Sodium: 138 mmol/L (ref 135–145)
Total Bilirubin: 1 mg/dL (ref 0.3–1.2)
Total Protein: 9.8 g/dL — ABNORMAL HIGH (ref 6.5–8.1)

## 2020-07-03 LAB — LACTIC ACID, PLASMA
Lactic Acid, Venous: 2.1 mmol/L (ref 0.5–1.9)
Lactic Acid, Venous: 1.1 mmol/L (ref 0.5–1.9)

## 2020-07-03 MED ORDER — SODIUM CHLORIDE 0.9 % IV SOLN: INTRAVENOUS | Status: DC

## 2020-07-03 MED ORDER — ZOLPIDEM TARTRATE 5 MG PO TABS
10.0000 mg | ORAL_TABLET | Freq: Every evening | ORAL | Status: DC | PRN
  Administered 2020-07-04: 10 mg via ORAL
  Filled 2020-07-03: qty 2

## 2020-07-03 MED ORDER — SODIUM CHLORIDE 0.9 % IV BOLUS
1000.0000 mL | Freq: Once | INTRAVENOUS | Status: AC
  Administered 2020-07-03: 1000 mL via INTRAVENOUS

## 2020-07-03 MED ORDER — HEPARIN SODIUM (PORCINE) 5000 UNIT/ML IJ SOLN
5000.0000 [IU] | Freq: Three times a day (TID) | INTRAMUSCULAR | Status: DC
  Administered 2020-07-03 – 2020-07-05 (×5): 5000 [IU] via SUBCUTANEOUS
  Filled 2020-07-03 (×5): qty 1

## 2020-07-03 MED ORDER — ALPRAZOLAM 0.5 MG PO TABS
0.5000 mg | ORAL_TABLET | Freq: Two times a day (BID) | ORAL | Status: DC | PRN
  Administered 2020-07-04: 0.5 mg via ORAL
  Filled 2020-07-03: qty 1

## 2020-07-03 MED ORDER — LOPERAMIDE HCL 2 MG PO CAPS
2.0000 mg | ORAL_CAPSULE | ORAL | Status: DC | PRN
  Administered 2020-07-04: 2 mg via ORAL
  Filled 2020-07-03: qty 1

## 2020-07-03 MED ORDER — POTASSIUM CHLORIDE 10 MEQ/100ML IV SOLN
10.0000 meq | INTRAVENOUS | Status: AC
  Administered 2020-07-03 (×3): 10 meq via INTRAVENOUS
  Filled 2020-07-03 (×3): qty 100

## 2020-07-03 MED ORDER — ONDANSETRON HCL 4 MG/2ML IJ SOLN
4.0000 mg | Freq: Four times a day (QID) | INTRAMUSCULAR | Status: DC | PRN
  Administered 2020-07-04: 4 mg via INTRAVENOUS
  Filled 2020-07-03: qty 2

## 2020-07-03 MED ORDER — TRAMADOL HCL 50 MG PO TABS: 50.0000 mg | ORAL_TABLET | Freq: Three times a day (TID) | ORAL | Status: DC | PRN

## 2020-07-03 MED ORDER — ONDANSETRON HCL 4 MG/2ML IJ SOLN
4.0000 mg | Freq: Once | INTRAMUSCULAR | Status: AC
  Administered 2020-07-03: 4 mg via INTRAVENOUS
  Filled 2020-07-03: qty 2

## 2020-07-03 MED ORDER — AMLODIPINE BESYLATE 5 MG PO TABS
5.0000 mg | ORAL_TABLET | Freq: Every evening | ORAL | Status: DC
  Administered 2020-07-03 – 2020-07-04 (×2): 5 mg via ORAL
  Filled 2020-07-03 (×2): qty 1

## 2020-07-03 MED ORDER — ONDANSETRON HCL 4 MG PO TABS: 4.0000 mg | ORAL_TABLET | Freq: Four times a day (QID) | ORAL | Status: DC | PRN

## 2020-07-03 NOTE — ED Notes (Signed)
Waiting for urine. Pt getting fluids at this time.

## 2020-07-03 NOTE — ED Notes (Signed)
Date and time results received: 07/03/20 0730   Test: Hemoglobin Critical Value: 21.4  Name of Provider Notified: [facility]  Orders Received? Or Actions Taken?: NA

## 2020-07-03 NOTE — ED Triage Notes (Signed)
Abdominal pain with nausea and vomiting  Onset yesterday

## 2020-07-03 NOTE — H&P (Signed)
TRH H&P    Patient Demographics:    [name], is a 55 y.o. male  MRN: [id number]  DOB - October 16, 1965  Admit Date - 07/03/2020  Referring MD/NP/PA: Jillyn Ledger  Outpatient Primary MD for the patient is [name], MD  Patient coming from: Home  Chief complaint- n/v/d   HPI:    [name]  is a 55 y.o. male, with HTN, GERD, anxiety, and more presents to the ED with a chief complaint of n/v/d, abdominal pain, and muscle cramping that started last night. Patient reports that he has had symptoms constantly since they started last night. He had too many episodes to count. No hematemesis, hematochezia, or melena. Patient denies fevers and chills. He reports that his last normal meal was yesterday during the day. He reports that his abdominal pain is muscular in nature and he attributes it to heaving associated with vomiting. This AM, his BL calves started cramping severely. He was drinking water and gatorade as much as he could, and it would come back up as soon as he drank it. His diarrhea was so often that he became incontinent, so he decided he had better come to the hospital.   In the ED T97.6, HR 74, BP 162/112 (Did not take BP meds today because he was too nauseous), Sats 95% WBC 10.8, Hgb 21.4 Chem: K+ 3.3, BUN 36, Cr 2.11 Negative covid 2 L bolus Zofran    Review of systems:    In addition to the HPI above,  No Fever-chills, No Headache, mild change in Vision - blurry, no change in hearing, No problems swallowing food or Liquids, No Chest pain, Cough or Shortness of Breath, No Blood in stool or Urine, No dysuria, No new skin rashes or bruises, No new joints pains-aches, Does have chronic arthritic pain No new weakness, tingling, numbness in any extremity, No recent weight gain or loss, No polyuria, polydypsia or polyphagia, Multiple mental stressors  All other systems reviewed and are  negative.    Past History of the following :    Past Medical History:  Diagnosis Date  . Anxiety attack   . Arthritis   . Difficulty sleeping   . Fatigue   . Fatty liver disease, nonalcoholic   . GERD (gastroesophageal reflux disease)   . H/O hypokalemia   . History of kidney stones   . Hypertension   . PONV (postoperative nausea and vomiting)   . Splenomegaly       Past Surgical History:  Procedure Laterality Date  . CHOLECYSTECTOMY     2003  . COLONOSCOPY WITH PROPOFOL N/A 01/17/2017   Procedure: COLONOSCOPY WITH PROPOFOL;  Surgeon: Danie Binder, MD;  Location[facility];  Service: Endoscopy;  Laterality: N/A;  10:45am  . DENTAL SURGERY    . ESOPHAGOGASTRODUODENOSCOPY (EGD) WITH PROPOFOL N/A 01/17/2017   Procedure: ESOPHAGOGASTRODUODENOSCOPY (EGD) WITH PROPOFOL;  Surgeon: Danie Binder, MD;  Location: [facility];  Service: Endoscopy;  Laterality: N/A;  . KNEE ARTHROSCOPY  2011 / 2012   left x2  . LITHOTRIPSY  2011  . PARTIAL KNEE ARTHROPLASTY  11/15/2011   Procedure: UNICOMPARTMENTAL KNEE;  Surgeon: Mauri Pole, MD;  Location: [facility];  Service: Orthopedics;  Laterality: Left;  . PARTIAL KNEE ARTHROPLASTY Left 09/04/2013   Procedure: REVISION POLY LEFT UNICOMPARTMENTAL ARTHROPLASTY;  Surgeon: Mauri Pole, MD;  Location: [facility];  Service: Orthopedics;  Laterality: Left;  . POLYPECTOMY  01/17/2017   Procedure: POLYPECTOMY;  Surgeon: Danie Binder, MD;  Location: [facility];  Service: Endoscopy;;  Cecal and sigmoid colon(HS)      Social History:      Social History   Tobacco Use  . Smoking status: Current Every Day Smoker    Packs/day: 1.00  . Smokeless tobacco: Never Used  Substance Use Topics  . Alcohol use: Yes    Comment: rare       Family History :     Family History  Problem Relation Age of Onset  . Coronary artery disease Other   . Aneurysm Sister   . Stroke Sister   . Colon polyps Neg Hx       Home Medications:   Prior  to Admission medications   Medication Sig Start Date End Date Taking? Authorizing Provider  ALPRAZolam Duanne Moron) 0.5 MG tablet Take 0.5 mg by mouth 2 (two) times daily as needed. For anxiety    [provider]  amLODipine (NORVASC) 5 MG tablet Take 5 mg by mouth every evening.     [provider]  chlorthalidone (HYGROTON) 25 MG tablet Take 25 mg by mouth daily.    [provider]  ibuprofen (ADVIL,MOTRIN) 800 MG tablet Take 800 mg by mouth 2 (two) times daily as needed (arthritis pain).     [provider]  lisinopril (PRINIVIL,ZESTRIL) 40 MG tablet Take 40 mg by mouth every evening.     [provider]  Olopatadine HCl (PAZEO) 0.7 % SOLN Apply 1 drop to eye daily.    [provider]  pantoprazole (PROTONIX) 40 MG tablet Take 40 mg by mouth every morning. 12/09/16   [provider]  RESTASIS MULTIDOSE 0.05 % ophthalmic emulsion Place 1 drop into both eyes 2 (two) times daily. 12/09/16   [provider]  zolpidem (AMBIEN) 10 MG tablet Take 10 mg by mouth at bedtime as needed for sleep.     [provider]     Allergies:     Allergies  Allergen Reactions  . Codeine Nausea Only, Swelling and Other (See Comments)    Reaction=swelling,chest tightness,itching,nausea  . Percocet [Oxycodone-Acetaminophen] Itching, Nausea Only, Swelling and Other (See Comments)    Reaction=chest tightness     Physical Exam:   Vitals  Blood pressure (!) 162/112, pulse 74, temperature 97.6 F (36.4 C), temperature source Oral, resp. rate 18, height 6\' 1"  (1.854 m), weight 109.8 kg, SpO2 95 %.  1.  General:  Supine in bed, no acute distress  2. Psychiatric: Mood and behavior normal for situation, oriented x 3, pleasant, cooperative with exam  3. Neurologic: Face symmetric, speech and language normal, moves all 4 extremities voluntarily, no acute deficit on limited exam  4. HEENMT:  Head is atraumatic, normal cephalic, pupils are  reactive to light, neck is supple, trachea is midline, mucous membranes are mildly dry  5. Respiratory : Lungs are clear to auscultation BL, no wheezes, rhonchi, or rales, no increased work of breathing or accessory muscle use  6. Cardiovascular : Heart rate is normal, rhythm is regular, no murmurs, rubs, or gallops, no  peripheral edema  7. Gastrointestinal:  Abdomen is soft, non tended, without distention or guarding, bowel sounds active  8. Skin:  Skin is warm, dry, and intact without acute lesions on limited exam  9.Musculoskeletal:  No acute deformities, no calf tenderness, no peripheral edema    Data Review:    CBC Recent Labs  Lab 07/03/20 1839  WBC 10.8*  HGB 21.4*  HCT 62.0*  PLT 239  MCV 87.2  MCH 30.1  MCHC 34.5  RDW 13.8  LYMPHSABS 0.3*  MONOABS 1.0  EOSABS 0.0  BASOSABS 0.0   ------------------------------------------------------------------------------------------------------------------  Results for orders placed or performed during the hospital encounter of 07/03/20 (from the past 48 hour(s))  Resp Panel by RT-PCR (Flu A&B, Covid) Nasopharyngeal Swab     Status: None   Collection Time: 07/03/20  6:30 PM   Specimen: Nasopharyngeal Swab; Nasopharyngeal(NP) swabs in vial transport medium  Result Value Ref Range   SARS Coronavirus 2 by RT PCR NEGATIVE NEGATIVE    Comment: (NOTE) SARS-CoV-2 target nucleic acids are NOT DETECTED.  The SARS-CoV-2 RNA is generally detectable in upper respiratory specimens during the acute phase of infection. The lowest concentration of SARS-CoV-2 viral copies this assay can detect is 138 copies/mL. A negative result does not preclude SARS-Cov-2 infection and should not be used as the sole basis for treatment or other patient management decisions. A negative result may occur with  improper specimen collection/handling, submission of specimen other than nasopharyngeal swab, presence of viral mutation(s) within the areas  targeted by this assay, and inadequate number of viral copies(<138 copies/mL). A negative result must be combined with clinical observations, patient history, and epidemiological information. The expected result is Negative.  Fact Sheet for Patients:  EntrepreneurPulse.com.au  Fact Sheet for Healthcare Providers:  IncredibleEmployment.be  This test is no t yet approved or cleared by the [place] FDA and  has been authorized for detection and/or diagnosis of SARS-CoV-2 by FDA under an Emergency Use Authorization (EUA). This EUA will remain  in effect (meaning this test can be used) for the duration of the COVID-19 declaration under Section 564(b)(1) of the Act, 21 U.S.C.section 360bbb-3(b)(1), unless the authorization is terminated  or revoked sooner.       Influenza A by PCR NEGATIVE NEGATIVE   Influenza B by PCR NEGATIVE NEGATIVE    Comment: (NOTE) The Xpert Xpress SARS-CoV-2/FLU/RSV plus assay is intended as an aid in the diagnosis of influenza from Nasopharyngeal swab specimens and should not be used as a sole basis for treatment. Nasal washings and aspirates are unacceptable for Xpert Xpress SARS-CoV-2/FLU/RSV testing.  Fact Sheet for Patients: EntrepreneurPulse.com.au  Fact Sheet for Healthcare Providers: IncredibleEmployment.be  This test is not yet approved or cleared by the [place] FDA and has been authorized for detection and/or diagnosis of SARS-CoV-2 by FDA under an Emergency Use Authorization (EUA). This EUA will remain in effect (meaning this test can be used) for the duration of the COVID-19 declaration under Section 564(b)(1) of the Act, 21 U.S.C. section 360bbb-3(b)(1), unless the authorization is terminated or revoked.  Performed at [facility], [address] [place], [place] [address]   Comprehensive metabolic panel     Status: Abnormal   Collection Time: 07/03/20  6:39  PM  Result Value Ref Range   Sodium 138 135 - 145 mmol/L   Potassium 3.3 (L) 3.5 - 5.1 mmol/L   Chloride 101 98 - 111 mmol/L   CO2 22 22 - 32 mmol/L   Glucose, Bld 137 (  H) 70 - 99 mg/dL    Comment: Glucose reference range applies only to samples taken after fasting for at least 8 hours.   BUN 38 (H) 6 - 20 mg/dL   Creatinine, Ser 2.11 (H) 0.61 - 1.24 mg/dL   Calcium 10.0 8.9 - 10.3 mg/dL   Total Protein 9.8 (H) 6.5 - 8.1 g/dL   Albumin 5.8 (H) 3.5 - 5.0 g/dL   AST 37 15 - 41 U/L   ALT 74 (H) 0 - 44 U/L   Alkaline Phosphatase 107 38 - 126 U/L   Total Bilirubin 1.0 0.3 - 1.2 mg/dL   GFR, Estimated 37 (L) >60 mL/min    Comment: (NOTE) Calculated using the CKD-EPI Creatinine Equation (2021)    Anion gap 15 5 - 15    Comment: Performed at [facility], [address] [place], [place] [address]  Lipase, blood     Status: None   Collection Time: 07/03/20  6:39 PM  Result Value Ref Range   Lipase 27 11 - 51 U/L    Comment: Performed at [facility], [address] [place], [place] [address]  CBC with Diff     Status: Abnormal   Collection Time: 07/03/20  6:39 PM  Result Value Ref Range   WBC 10.8 (H) 4.0 - 10.5 K/uL   RBC 7.11 (H) 4.22 - 5.81 MIL/uL   Hemoglobin 21.4 (HH) 13.0 - 17.0 g/dL    Comment: REPEATED TO VERIFY CRITICAL RESULT CALLED TO, READ BACK BY AND VERIFIED WITH: THOMPSON,R @ 1930 ON 07/03/20 BY JUW    HCT 62.0 (H) 39.0 - 52.0 %   MCV 87.2 80.0 - 100.0 fL   MCH 30.1 26.0 - 34.0 pg   MCHC 34.5 30.0 - 36.0 g/dL   RDW 13.8 11.5 - 15.5 %   Platelets 239 150 - 400 K/uL   nRBC 0.0 0.0 - 0.2 %   Neutrophils Relative % 87 %   Neutro Abs 9.4 (H) 1.7 - 7.7 K/uL   Lymphocytes Relative 3 %   Lymphs Abs 0.3 (L) 0.7 - 4.0 K/uL   Monocytes Relative 9 %   Monocytes Absolute 1.0 0.1 - 1.0 K/uL   Eosinophils Relative 0 %   Eosinophils Absolute 0.0 0.0 - 0.5 K/uL   Basophils Relative 0 %   Basophils Absolute 0.0 0.0 - 0.1 K/uL   Immature Granulocytes 1 %   Abs Immature  Granulocytes 0.05 0.00 - 0.07 K/uL    Comment: Performed at [facility] [address] [place], [place] [address]    Chemistries  Recent Labs  Lab 07/03/20 1839  NA 138  K 3.3*  CL 101  CO2 22  GLUCOSE 137*  BUN 38*  CREATININE 2.11*  CALCIUM 10.0  AST 37  ALT 74*  ALKPHOS 107  BILITOT 1.0   ------------------------------------------------------------------------------------------------------------------  ------------------------------------------------------------------------------------------------------------------ GFR: Estimated Creatinine Clearance: 52.6 mL/min (A) (by C-G formula based on SCr of 2.11 mg/dL (H)). Liver Function Tests: Recent Labs  Lab 07/03/20 1839  AST 37  ALT 74*  ALKPHOS 107  BILITOT 1.0  PROT 9.8*  ALBUMIN 5.8*   Recent Labs  Lab 07/03/20 1839  LIPASE 27   No results for input(s): AMMONIA in the last 168 hours. Coagulation Profile: No results for input(s): INR, PROTIME in the last 168 hours. Cardiac Enzymes: No results for input(s): CKTOTAL, CKMB, CKMBINDEX, TROPONINI in the last 168 hours. BNP (last 3 results) No results for input(s): PROBNP in the last 8760 hours. HbA1C: No results  for input(s): HGBA1C in the last 72 hours. CBG: No results for input(s): GLUCAP in the last 168 hours. Lipid Profile: No results for input(s): CHOL, HDL, LDLCALC, TRIG, CHOLHDL, LDLDIRECT in the last 72 hours. Thyroid Function Tests: No results for input(s): TSH, T4TOTAL, FREET4, T3FREE, THYROIDAB in the last 72 hours. Anemia Panel: No results for input(s): VITAMINB12, FOLATE, FERRITIN, TIBC, IRON, RETICCTPCT in the last 72 hours.  --------------------------------------------------------------------------------------------------------------- Urine analysis:    Component Value Date/Time   COLORURINE YELLOW 09/04/2013 1324   APPEARANCEUR CLEAR 09/04/2013 1324   LABSPEC 1.022 09/04/2013 1324   PHURINE 6.5 09/04/2013 1324   GLUCOSEU NEGATIVE  09/04/2013 1324   HGBUR NEGATIVE 09/04/2013 1324   BILIRUBINUR NEGATIVE 09/04/2013 1324   KETONESUR NEGATIVE 09/04/2013 1324   PROTEINUR NEGATIVE 09/04/2013 1324   UROBILINOGEN 1.0 09/04/2013 1324   NITRITE NEGATIVE 09/04/2013 1324   LEUKOCYTESUR NEGATIVE 09/04/2013 1324      Imaging Results:    DG Abd 1 View  Result Date: 07/03/2020 CLINICAL DATA:  Nausea with vomiting, diarrhea and muscle cramping for 24 hours. EXAM: ABDOMEN - 1 VIEW COMPARISON:  None available. Report only from abdominal CT 09/04/2014. FINDINGS: The bowel gas pattern is normal. There is no evidence of free intraperitoneal air or bowel wall thickening. Cholecystectomy clips are noted. No suspicious abdominal calcifications are seen. There are mild degenerative changes in the thoracolumbar spine. IMPRESSION: No radiographic evidence of active abdominal process. Electronically Signed   By: Richardean Sale M.D.   On: 07/03/2020 20:26      Assessment & Plan:    Principal Problem:   AKI (acute kidney injury) ([place]) Active Problems:   Essential hypertension   Dehydration   Hypokalemia   Anxiety   1. Dehydration 1. 2/2 GI losses 2. With AKI, lactic acid pending, and hemoconcentration  3. BUN:Cr 36:2.11 4. 2L Bolus in ED 5. Continue NS 119ml 6. Continue to monitor 2. AKI 1. Cr Baseline 0.8 today 2.11 2. 2/2 dehydration and chronic ibuprofen 800mg  TID 3. Fluids as above 4. Continue to monitor 3. Viral gastroentereritis 1. Abdominal pain, n/v/d 2. Lipase 27 3. KUB pending 4. Continue zofran 5. Continue loperamide as needed 6. No leukocytosis, fever, or chills 4. Hypokalemia 1. 2/2 GI losses 2. Monitor on tele 3. Replace with IV K+ in the setting of n/v/d 5. Arthritis pain 1. Multiple allergies 2. Holding ibuprofen 3. Will trial tramadol 6. HTN 1. Holding chlorthalidone 2. Hold lisinopril 7. Anxiety 1. Continue xanax 8.    DVT Prophylaxis-   heparin - SCDs  AM Labs Ordered, also please review  Full Orders  Family Communication: Admission, patients condition and plan of care including tests being ordered have been discussed with the patient and wife who indicate understanding and agree with the plan and Code Status.  Code Status: full  Admission status: Observation  Time spent in minutes : [place]

## 2020-07-03 NOTE — ED Provider Notes (Signed)
[facility] EMERGENCY DEPARTMENT Provider Note   CSN: [id number] Arrival date & time: 07/03/20  1801     History Chief Complaint  Patient presents with  . Abdominal Pain    [name] is a 55 y.o. male presented for evaluation of nausea, vomiting, diarrhea.  Patient states his symptoms began around 930 last night.  He reports acute onset nausea and vomiting.  He has had approximately 20 episodes of emesis, no blood in his emesis.  He reports associated diarrhea, 15-20 episodes.  No blood.  He reports intermittent abdominal cramping or achiness, but none currently.  He denies fevers, chills, chest pain, shortness of breath, cough.  He has decreased urination at this point.  He has been unable to tolerate any p.o. today.  He has not taken anything for his symptoms due to his inability to tolerate p.o.  He denies sick contacts.  He has a history of gallbladder surgery, no other abdominal surgeries.  He states he is feeling weak, tired, and starting to have muscle cramps which is consistent with when he has had low potassium before. He reports a history of high blood pressure for which he takes medication.  Additional history of GERD for which he takes Protonix and arthritis for which he takes ibuprofen 3 times a day.  He smokes cigarettes (1/2-2/3 ppd) daily, denies alcohol or drug use.  Additional history obtained in chart review.  History of anxiety, arthritis, GERD, hypertension, hypokalemia.  HPI     Past Medical History:  Diagnosis Date  . Anxiety attack   . Arthritis   . Difficulty sleeping   . Fatigue   . Fatty liver disease, nonalcoholic   . GERD (gastroesophageal reflux disease)   . H/O hypokalemia   . History of kidney stones   . Hypertension   . PONV (postoperative nausea and vomiting)   . Splenomegaly     Patient Active Problem List   Diagnosis Date Noted  . AKI (acute kidney injury) ([place]) 07/03/2020  . Polyp of colon   . 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 RUQ discomfort 12/23/2016  . Colon cancer screening 12/23/2016  . Encounter for long-term (current) use of NSAIDs 12/23/2016  . 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  . HYPERTENSION, UNSPECIFIED 11/03/2008    Past Surgical History:  Procedure Laterality Date  . CHOLECYSTECTOMY     2003  . COLONOSCOPY WITH PROPOFOL N/A 01/17/2017   Procedure: COLONOSCOPY WITH PROPOFOL;  Surgeon: Danie Binder, MD;  Location[facility];  Service: Endoscopy;  Laterality: N/A;  10:45am  . DENTAL SURGERY    . ESOPHAGOGASTRODUODENOSCOPY (EGD) WITH PROPOFOL N/A 01/17/2017   Procedure: ESOPHAGOGASTRODUODENOSCOPY (EGD) WITH PROPOFOL;  Surgeon: Danie Binder, MD;  Location: [facility];  Service: Endoscopy;  Laterality: N/A;  . KNEE ARTHROSCOPY  2011 / 2012   left x2  . LITHOTRIPSY  2011  . PARTIAL KNEE ARTHROPLASTY  11/15/2011   Procedure: UNICOMPARTMENTAL KNEE;  Surgeon: Mauri Pole, MD;  Location: [facility] ORS;  Service: Orthopedics;  Laterality: Left;  . PARTIAL KNEE ARTHROPLASTY Left 09/04/2013   Procedure: REVISION POLY LEFT UNICOMPARTMENTAL ARTHROPLASTY;  Surgeon: Mauri Pole, MD;  Location: [facility] OR[facility];  Service: Orthopedics;  Laterality: Left;  . POLYPECTOMY  01/17/2017   Procedure: POLYPECTOMY;  Surgeon: Danie Binder, MD;  Location[facility];  Service: Endoscopy;;  Cecal and sigmoid colon(HS)       Family History  Problem Relation Age of Onset  . Coronary  artery disease Other   . Aneurysm Sister   . Stroke Sister   . Colon polyps Neg Hx     Social History   Tobacco Use  . Smoking status: Current Every Day Smoker    Packs/day: 1.00  . Smokeless tobacco: Never Used  Substance Use Topics  . Alcohol use: Yes    Comment: rare  . Drug use: No    Home Medications Prior to Admission medications   Medication Sig Start Date End Date Taking? Authorizing Provider  ALPRAZolam Duanne Moron) 0.5 MG tablet Take 0.5 mg by mouth 2 (two) times daily as needed. For anxiety     [provider]  amLODipine (NORVASC) 5 MG tablet Take 5 mg by mouth every evening.     [provider]  chlorthalidone (HYGROTON) 25 MG tablet Take 25 mg by mouth daily.    [provider]  ibuprofen (ADVIL,MOTRIN) 800 MG tablet Take 800 mg by mouth 2 (two) times daily as needed (arthritis pain).     [provider]  lisinopril (PRINIVIL,ZESTRIL) 40 MG tablet Take 40 mg by mouth every evening.     [provider]  Olopatadine HCl (PAZEO) 0.7 % SOLN Apply 1 drop to eye daily.    [provider]  pantoprazole (PROTONIX) 40 MG tablet Take 40 mg by mouth every morning. 12/09/16   [provider]  RESTASIS MULTIDOSE 0.05 % ophthalmic emulsion Place 1 drop into both eyes 2 (two) times daily. 12/09/16   [provider]  zolpidem (AMBIEN) 10 MG tablet Take 10 mg by mouth at bedtime as needed for sleep.     [provider]    Allergies    Codeine and Percocet [oxycodone-acetaminophen]  Review of Systems   Review of Systems  Gastrointestinal: Positive for diarrhea, nausea and vomiting.  Neurological: Positive for weakness.  All other systems reviewed and are negative.   Physical Exam Updated Vital Signs BP (!) 162/112 (BP Location: Right Arm)   Pulse 74   Temp 97.6 F (36.4 C) (Oral)   Resp 18   Ht 6\' 1"  (1.854 m)   Wt 109.8 kg   SpO2 95%   BMI 31.93 kg/m   Physical Exam Vitals and nursing note reviewed.  Constitutional:      General: He is not in acute distress.    Appearance: He is well-developed.     Comments: Resting comfortably in the bed in NAD  HENT:     Head: Normocephalic and atraumatic.     Mouth/Throat:     Mouth: Mucous membranes are dry.  Eyes:     Conjunctiva/sclera: Conjunctivae normal.     Pupils: Pupils are equal, round, and reactive to light.  Cardiovascular:     Rate and Rhythm: Normal rate and regular rhythm.     Pulses: Normal pulses.  Pulmonary:     Effort: Pulmonary effort is  normal. No respiratory distress.     Breath sounds: Normal breath sounds. No wheezing.  Abdominal:     General: There is no distension.     Palpations: Abdomen is soft. There is no mass.     Tenderness: There is no abdominal tenderness. There is no guarding or rebound.     Comments: No TTP of the abdomen.  No rigidity, guarding, distention.  Negative rebound.  No peritonitis.  Musculoskeletal:        General: Normal range of motion.     Cervical back: Normal range of motion and neck supple.  Skin:  General: Skin is warm and dry.     Capillary Refill: Capillary refill takes less than 2 seconds.  Neurological:     Mental Status: He is alert and oriented to person, place, and time.     ED Results / Procedures / Treatments   Labs (all labs ordered are listed, but only abnormal results are displayed) Labs Reviewed  COMPREHENSIVE METABOLIC PANEL - Abnormal; Notable for the following components:      Result Value   Potassium 3.3 (*)    Glucose, Bld 137 (*)    BUN 38 (*)    Creatinine, Ser 2.11 (*)    Total Protein 9.8 (*)    Albumin 5.8 (*)    ALT 74 (*)    GFR, Estimated 37 (*)    All other components within normal limits  CBC WITH DIFFERENTIAL/PLATELET - Abnormal; Notable for the following components:   WBC 10.8 (*)    RBC 7.11 (*)    Hemoglobin 21.4 (*)    HCT 62.0 (*)    Neutro Abs 9.4 (*)    Lymphs Abs 0.3 (*)    All other components within normal limits  RESP PANEL BY RT-PCR (FLU A&B, COVID) ARPGX2  LIPASE, BLOOD  URINALYSIS, ROUTINE W REFLEX MICROSCOPIC  LACTIC ACID, PLASMA  LACTIC ACID, PLASMA    EKG None  Radiology No results found.  Procedures Procedures   Medications Ordered in ED Medications  loperamide (IMODIUM) capsule 2 mg (has no administration in time range)  0.9 %  sodium chloride infusion (has no administration in time range)  potassium chloride 10 mEq in 100 mL IVPB (has no administration in time range)  ondansetron (ZOFRAN) injection 4 mg  (4 mg Intravenous Given 07/03/20 1915)  sodium chloride 0.9 % bolus 1,000 mL (1,000 mLs Intravenous New Bag/Given 07/03/20 1916)  sodium chloride 0.9 % bolus 1,000 mL (1,000 mLs Intravenous New Bag/Given 07/03/20 1940)    ED Course  I have reviewed the triage vital signs and the nursing notes.  Pertinent labs & imaging results that were available during my care of the patient were reviewed by me and considered in my medical decision making (see chart for details).    MDM Rules/Calculators/A&P                          Patient presented for evaluation nausea, vomiting, diarrhea.  On exam, patient appears nontoxic, though he does appear slightly dehydrated and that his mucous membranes are dry.  He has no abdominal pain or fevers, doubt intra-abdominal infection.  Likely viral GI illness.  Also consider GERD/gastritis, although less likely with the diarrhea.  Will treat symptomatically, obtain labs to ensure no significant electrolyte abnormality, reassess.  On reassessment, patient reports nausea is improved.  Otherwise symptoms are similar.  Labs interpreted by me, shows AKI with a creatinine currently at 2.11, baseline 0.86.  Likely due to dehydration.  Also consider chronic NSAID use as a contributing factor.  Patient is very hemoconcentrated with a hemoglobin of 21.4.  Once again, likely due to dehydration.  Will give a second liter of fluids and call for admission for continued IV fluids and recheck of labs.  Discussed with Dr. Clearence Ped from [facility] hospitalist service, pt to be admitted.   Final Clinical Impression(s) / ED Diagnoses Final diagnoses:  AKI (acute kidney injury) ([place])  Nausea vomiting and diarrhea  Elevated hemoglobin ([place])    Rx / DC Orders ED Discharge Orders  None       Franchot Heidelberg, PA-C 07/03/20 Orlena Sheldon, MD 07/05/20 [phone]

## 2020-07-03 NOTE — ED Provider Notes (Signed)
MSE was initiated and I personally evaluated the patient and placed orders (if any) at  6:18 PM on July 03, 2020.  The patient appears stable so that the remainder of the MSE may be completed by another provider.  Patient with a 24-hour history of nausea, vomiting and diarrhea along with development of muscle cramping, reports has intermittent problems similar to this which is usually associated with any low potassium level.  He has been unable to tolerate any p.o. intake including trial of potassium tablets.  Denies fevers, denies abdominal pain, feels weak and dehydrated.  Labs have been ordered, IV fluids, pending placement in exam room for full evaluation.   Evalee Jefferson, PA-C 07/03/20 1819    [place] Ferguson, MD 07/05/20 [phone]

## 2020-07-03 NOTE — ED Notes (Signed)
Date and time results received: 07/03/20 0940  Test: Lactic Critical Value: 2.1  Name of Provider Notified: Dr Earnest Conroy  Orders Received? Or Actions Taken?: NA

## 2020-07-03 NOTE — ED Notes (Signed)
Patient transported to X-ray 

## 2020-07-04 ENCOUNTER — Encounter (HOSPITAL_COMMUNITY): Payer: Self-pay | Admitting: Family Medicine

## 2020-07-04 DIAGNOSIS — I10 Essential (primary) hypertension: Secondary | ICD-10-CM

## 2020-07-04 DIAGNOSIS — Z28310 Unvaccinated for covid-19: Secondary | ICD-10-CM | POA: Diagnosis not present

## 2020-07-04 DIAGNOSIS — F411 Generalized anxiety disorder: Secondary | ICD-10-CM | POA: Diagnosis present

## 2020-07-04 DIAGNOSIS — N179 Acute kidney failure, unspecified: Secondary | ICD-10-CM | POA: Diagnosis present

## 2020-07-04 DIAGNOSIS — Z20822 Contact with and (suspected) exposure to covid-19: Secondary | ICD-10-CM | POA: Diagnosis present

## 2020-07-04 DIAGNOSIS — E876 Hypokalemia: Secondary | ICD-10-CM

## 2020-07-04 DIAGNOSIS — Z79899 Other long term (current) drug therapy: Secondary | ICD-10-CM | POA: Diagnosis not present

## 2020-07-04 DIAGNOSIS — F17210 Nicotine dependence, cigarettes, uncomplicated: Secondary | ICD-10-CM | POA: Diagnosis present

## 2020-07-04 DIAGNOSIS — A080 Rotaviral enteritis: Secondary | ICD-10-CM | POA: Diagnosis present

## 2020-07-04 DIAGNOSIS — E860 Dehydration: Secondary | ICD-10-CM | POA: Diagnosis present

## 2020-07-04 DIAGNOSIS — K760 Fatty (change of) liver, not elsewhere classified: Secondary | ICD-10-CM | POA: Diagnosis present

## 2020-07-04 DIAGNOSIS — K219 Gastro-esophageal reflux disease without esophagitis: Secondary | ICD-10-CM | POA: Diagnosis present

## 2020-07-04 LAB — COMPREHENSIVE METABOLIC PANEL
ALT: 54 U/L — ABNORMAL HIGH (ref 0–44)
AST: 34 U/L (ref 15–41)
Albumin: 4.1 g/dL (ref 3.5–5.0)
Alkaline Phosphatase: 76 U/L (ref 38–126)
Anion gap: 13 (ref 5–15)
BUN: 40 mg/dL — ABNORMAL HIGH (ref 6–20)
CO2: 20 mmol/L — ABNORMAL LOW (ref 22–32)
Calcium: 8.6 mg/dL — ABNORMAL LOW (ref 8.9–10.3)
Chloride: 107 mmol/L (ref 98–111)
Creatinine, Ser: 1.02 mg/dL (ref 0.61–1.24)
GFR, Estimated: >60 mL/min (ref >60)
Glucose, Bld: 97 mg/dL (ref 70–99)
Potassium: 2.8 mmol/L — ABNORMAL LOW (ref 3.5–5.1)
Sodium: 140 mmol/L (ref 135–145)
Total Bilirubin: 0.8 mg/dL (ref 0.3–1.2)
Total Protein: 7 g/dL (ref 6.5–8.1)

## 2020-07-04 LAB — CBC WITH DIFFERENTIAL/PLATELET
Abs Immature Granulocytes: 0.03 10*3/uL (ref 0.00–0.07)
Basophils Absolute: 0 10*3/uL (ref 0.0–0.1)
Basophils Relative: 0 %
Eosinophils Absolute: 0 10*3/uL (ref 0.0–0.5)
Eosinophils Relative: 0 %
HCT: 50.4 % (ref 39.0–52.0)
Hemoglobin: 17.3 g/dL — ABNORMAL HIGH (ref 13.0–17.0)
Immature Granulocytes: 0 %
Lymphocytes Relative: 8 %
Lymphs Abs: 0.7 10*3/uL (ref 0.7–4.0)
MCH: 29.9 pg (ref 26.0–34.0)
MCHC: 34.3 g/dL (ref 30.0–36.0)
MCV: 87 fL (ref 80.0–100.0)
Monocytes Absolute: 1.1 10*3/uL — ABNORMAL HIGH (ref 0.1–1.0)
Monocytes Relative: 13 %
Neutro Abs: 6.5 10*3/uL (ref 1.7–7.7)
Neutrophils Relative %: 79 %
Platelets: 173 10*3/uL (ref 150–400)
RBC: 5.79 MIL/uL (ref 4.22–5.81)
RDW: 13.1 % (ref 11.5–15.5)
WBC: 8.4 10*3/uL (ref 4.0–10.5)
nRBC: 0 % (ref 0.0–0.2)

## 2020-07-04 LAB — GASTROINTESTINAL PANEL BY PCR, STOOL (REPLACES STOOL CULTURE)

## 2020-07-04 LAB — C DIFFICILE QUICK SCREEN W PCR REFLEX
C Diff antigen: NEGATIVE
C Diff interpretation: NOT DETECTED
C Diff toxin: NEGATIVE

## 2020-07-04 LAB — URINALYSIS, ROUTINE W REFLEX MICROSCOPIC
Bilirubin Urine: NEGATIVE
Glucose, UA: NEGATIVE mg/dL
Ketones, ur: NEGATIVE mg/dL
Leukocytes,Ua: NEGATIVE
Nitrite: NEGATIVE
Protein, ur: 100 mg/dL — AB
Specific Gravity, Urine: 1.02 (ref 1.005–1.030)
pH: 5 (ref 5.0–8.0)

## 2020-07-04 LAB — MRSA PCR SCREENING: MRSA by PCR: NEGATIVE

## 2020-07-04 LAB — HIV ANTIBODY (ROUTINE TESTING W REFLEX): HIV Screen 4th Generation wRfx: NONREACTIVE

## 2020-07-04 MED ORDER — ACETAMINOPHEN 325 MG PO TABS
650.0000 mg | ORAL_TABLET | Freq: Four times a day (QID) | ORAL | Status: DC | PRN
  Administered 2020-07-04: 650 mg via ORAL
  Filled 2020-07-04: qty 2

## 2020-07-04 MED ORDER — POTASSIUM CHLORIDE CRYS ER 20 MEQ PO TBCR
40.0000 meq | EXTENDED_RELEASE_TABLET | Freq: Every day | ORAL | Status: AC
  Administered 2020-07-04: 40 meq via ORAL
  Filled 2020-07-04: qty 2

## 2020-07-04 MED ORDER — CHLORHEXIDINE GLUCONATE CLOTH 2 % EX PADS
6.0000 | MEDICATED_PAD | Freq: Every day | CUTANEOUS | Status: DC
  Administered 2020-07-04 – 2020-07-05 (×2): 6 via TOPICAL

## 2020-07-04 MED ORDER — PANTOPRAZOLE SODIUM 40 MG IV SOLR
40.0000 mg | INTRAVENOUS | Status: DC
  Administered 2020-07-04 – 2020-07-05 (×2): 40 mg via INTRAVENOUS
  Filled 2020-07-04 (×2): qty 40

## 2020-07-04 MED ORDER — POTASSIUM CHLORIDE IN NACL 40-0.9 MEQ/L-% IV SOLN: INTRAVENOUS | Status: DC

## 2020-07-04 NOTE — Progress Notes (Signed)
PROGRESS NOTE   [name]  DZH:[id number] DOB: November 02, 1965 DOA: 07/03/2020 PCP: [name], MD   Chief Complaint  Patient presents with  . Abdominal Pain   Level of care: Telemetry  Brief Admission History:  55 y.o. male, with HTN, GERD, anxiety, and more presents to the ED with a chief complaint of n/v/d, abdominal pain, and muscle cramping that started last night. Patient reports that he has had symptoms constantly since they started last night. He had too many episodes to count. No hematemesis, hematochezia, or melena. Patient denies fevers and chills. He reports that his last normal meal was yesterday during the day. He reports that his abdominal pain is muscular in nature and he attributes it to heaving associated with vomiting. This AM, his BL calves started cramping severely. He was drinking water and gatorade as much as he could, and it would come back up as soon as he drank it. His diarrhea was so often that he became incontinent, so he decided he had better come to the hospital.   Assessment & Plan:   Principal Problem:   AKI (acute kidney injury) ([place]) Active Problems:   Essential hypertension   Dehydration   Hypokalemia   Anxiety   Acute renal failure (ARF) ([place])  1. AKI - prerenal from nausea, vomiting and diarrhea.  Continue IV fluid, recheck BMP in AM.  2. Acute gastroenteritis - He continues to have watery diarrhea.  Check stool for C diff and GI pathogen panel, continue supportive therapy.  3. Essential Hypertension - Holding home chlorthalidone.  4. Hypokalemia -from GI losses, IV replacement ordered.  Recheck in AM. Replace Mg as needed.   DVT prophylaxis: SQ heparin Code Status: full  Family Communication: patient updated at bedside, verbalizes understanding Disposition:  Home  Status is: Observation  The patient will require care spanning > 2 midnights and should be moved to inpatient because: IV treatments appropriate due to intensity of illness or  inability to take PO and Inpatient level of care appropriate due to severity of illness  Dispo: The patient is from: Home              Anticipated d/c is to: Home              Patient currently is not medically stable to d/c.   Difficult to place patient No  Consultants:     Procedures:     Antimicrobials:    Subjective: Pt reports he continues to have frequent watery diarrhea, he is vomiting and nauseated. No abdominal pain.   Objective: Vitals:   07/04/20 0609 07/04/20 0700 07/04/20 0758 07/04/20 1000  BP: 133/80 140/79  (!) 126/54  Pulse: 80 77  80  Resp: 16 (!) 26  (!) 23  Temp:   98.6 F (37 C)   TempSrc:   Oral   SpO2: 92% 93%  92%  Weight:      Height:        Intake/Output Summary (Last 24 hours) at 07/04/2020 1059 Last data filed at 07/04/2020 0600 Gross per 24 hour  Intake 2285.42 ml  Output --  Net 2285.42 ml   Filed Weights   07/03/20 1826 07/04/20 0201  Weight: 109.8 kg 107.2 kg    Examination:  General exam: Appears calm and comfortable  Respiratory system: Clear to auscultation. Respiratory effort normal. Cardiovascular system: normal S1 & S2 heard. No JVD, murmurs, rubs, gallops or clicks. No pedal edema. Gastrointestinal system: Abdomen is nondistended, soft and nontender. No organomegaly  or masses felt. Normal bowel sounds heard. Central nervous system: Alert and oriented. No focal neurological deficits. Extremities: Symmetric 5 x 5 power. Skin: No rashes, lesions or ulcers Psychiatry: Judgement and insight appear normal. Mood & affect appropriate.   Data Reviewed: I have personally reviewed following labs and imaging studies  CBC: Recent Labs  Lab 07/03/20 1839 07/04/20 0521  WBC 10.8* 8.4  NEUTROABS 9.4* 6.5  HGB 21.4* 17.3*  HCT 62.0* 50.4  MCV 87.2 87.0  PLT 239 097    Basic Metabolic Panel: Recent Labs  Lab 07/03/20 1839 07/04/20 0521  NA 138 140  K 3.3* 2.8*  CL 101 107  CO2 22 20*  GLUCOSE 137* 97  BUN 38* 40*   CREATININE 2.11* 1.02  CALCIUM 10.0 8.6*  MG 2.4  --     GFR: Estimated Creatinine Clearance: 107.6 mL/min (by C-G formula based on SCr of 1.02 mg/dL).  Liver Function Tests: Recent Labs  Lab 07/03/20 1839 07/04/20 0521  AST 37 34  ALT 74* 54*  ALKPHOS 107 76  BILITOT 1.0 0.8  PROT 9.8* 7.0  ALBUMIN 5.8* 4.1    CBG: No results for input(s): GLUCAP in the last 168 hours.  Recent Results (from the past 240 hour(s))  Resp Panel by RT-PCR (Flu A&B, Covid) Nasopharyngeal Swab     Status: None   Collection Time: 07/03/20  6:30 PM   Specimen: Nasopharyngeal Swab; Nasopharyngeal(NP) swabs in vial transport medium  Result Value Ref Range Status   SARS Coronavirus 2 by RT PCR NEGATIVE NEGATIVE Final    Comment: (NOTE) SARS-CoV-2 target nucleic acids are NOT DETECTED.  The SARS-CoV-2 RNA is generally detectable in upper respiratory specimens during the acute phase of infection. The lowest concentration of SARS-CoV-2 viral copies this assay can detect is 138 copies/mL. A negative result does not preclude SARS-Cov-2 infection and should not be used as the sole basis for treatment or other patient management decisions. A negative result may occur with  improper specimen collection/handling, submission of specimen other than nasopharyngeal swab, presence of viral mutation(s) within the areas targeted by this assay, and inadequate number of viral copies(<138 copies/mL). A negative result must be combined with clinical observations, patient history, and epidemiological information. The expected result is Negative.  Fact Sheet for Patients:  EntrepreneurPulse.com.au  Fact Sheet for Healthcare Providers:  IncredibleEmployment.be  This test is no t yet approved or cleared by the [place] FDA and  has been authorized for detection and/or diagnosis of SARS-CoV-2 by FDA under an Emergency Use Authorization (EUA). This EUA will remain  in  effect (meaning this test can be used) for the duration of the COVID-19 declaration under Section 564(b)(1) of the Act, 21 U.S.C.section 360bbb-3(b)(1), unless the authorization is terminated  or revoked sooner.       Influenza A by PCR NEGATIVE NEGATIVE Final   Influenza B by PCR NEGATIVE NEGATIVE Final    Comment: (NOTE) The Xpert Xpress SARS-CoV-2/FLU/RSV plus assay is intended as an aid in the diagnosis of influenza from Nasopharyngeal swab specimens and should not be used as a sole basis for treatment. Nasal washings and aspirates are unacceptable for Xpert Xpress SARS-CoV-2/FLU/RSV testing.  Fact Sheet for Patients: EntrepreneurPulse.com.au  Fact Sheet for Healthcare Providers: IncredibleEmployment.be  This test is not yet approved or cleared by the [place] FDA and has been authorized for detection and/or diagnosis of SARS-CoV-2 by FDA under an Emergency Use Authorization (EUA). This EUA will remain in effect (meaning this test can  be used) for the duration of the COVID-19 declaration under Section 564(b)(1) of the Act, 21 U.S.C. section 360bbb-3(b)(1), unless the authorization is terminated or revoked.  Performed at [facility], [address] [place], [place] [address]   MRSA PCR Screening     Status: None   Collection Time: 07/04/20  2:18 AM   Specimen: Nasal Mucosa; Nasopharyngeal  Result Value Ref Range Status   MRSA by PCR NEGATIVE NEGATIVE Final    Comment:        The GeneXpert MRSA Assay (FDA approved for NASAL specimens only), is one component of a comprehensive MRSA colonization surveillance program. It is not intended to diagnose MRSA infection nor to guide or monitor treatment for MRSA infections. Performed at [facility], [address], [place], [place] [address]   C Difficile Quick Screen w PCR reflex     Status: None   Collection Time: 07/04/20  7:31 AM   Specimen: STOOL  Result Value Ref Range Status    C Diff antigen NEGATIVE NEGATIVE Final   C Diff toxin NEGATIVE NEGATIVE Final   C Diff interpretation No C. difficile detected.  Final    Comment: Performed at [facility], [address] [place], [place] [address]     Radiology Studies: DG Abd 1 View  Result Date: 07/03/2020 CLINICAL DATA:  Nausea with vomiting, diarrhea and muscle cramping for 24 hours. EXAM: ABDOMEN - 1 VIEW COMPARISON:  None available. Report only from abdominal CT 09/04/2014. FINDINGS: The bowel gas pattern is normal. There is no evidence of free intraperitoneal air or bowel wall thickening. Cholecystectomy clips are noted. No suspicious abdominal calcifications are seen. There are mild degenerative changes in the thoracolumbar spine. IMPRESSION: No radiographic evidence of active abdominal process. Electronically Signed   By: Richardean Sale M.D.   On: 07/03/2020 20:26    Scheduled Meds: . amLODipine  5 mg Oral QPM  . Chlorhexidine Gluconate Cloth  6 each Topical Daily  . heparin  5,000 Units Subcutaneous Q8H  . pantoprazole (PROTONIX) IV  40 mg Intravenous Q24H  . potassium chloride  40 mEq Oral QHS   Continuous Infusions: . 0.9 % NaCl with KCl 40 mEq / L 135 mL/hr at 07/04/20 0909     LOS: 0 days   Time spent: 36 mins  Rooney Gladwin Wynetta Emery, MD How to contact the [facility] Attending or Consulting provider [place] or covering provider during after hours [place], for this patient?  1. Check the care team in [facility] and look for a) attending/consulting [facility] provider listed and b) the [facility] team listed 2. Log into [email or web address] and use [place]'s universal password to access. If you do not have the password, please contact the hospital operator. 3. Locate the [facility] provider you are looking for under [facility] and page to a number that you can be directly reached. 4. If you still have difficulty reaching the provider, please page the [facility] (Director on Call) for the Hospitalists listed on amion for assistance.  07/04/2020,  10:59 AM

## 2020-07-04 NOTE — ED Notes (Signed)
Pt walked to bathroom and was able to give a urine. Pt given a pillow and blanket. Pt denies any other needs at this time. Will continue to monitor.

## 2020-07-04 NOTE — Progress Notes (Signed)
Report called and given to RN on Dept. 300. Pt to be transported via WC to room 326.

## 2020-07-05 DIAGNOSIS — E860 Dehydration: Secondary | ICD-10-CM | POA: Diagnosis not present

## 2020-07-05 DIAGNOSIS — I10 Essential (primary) hypertension: Secondary | ICD-10-CM | POA: Diagnosis not present

## 2020-07-05 DIAGNOSIS — E876 Hypokalemia: Secondary | ICD-10-CM | POA: Diagnosis not present

## 2020-07-05 DIAGNOSIS — N179 Acute kidney failure, unspecified: Secondary | ICD-10-CM | POA: Diagnosis not present

## 2020-07-05 LAB — BASIC METABOLIC PANEL
Anion gap: 9 (ref 5–15)
BUN: 23 mg/dL — ABNORMAL HIGH (ref 6–20)
CO2: 25 mmol/L (ref 22–32)
Calcium: 8.5 mg/dL — ABNORMAL LOW (ref 8.9–10.3)
Chloride: 104 mmol/L (ref 98–111)
Creatinine, Ser: 0.89 mg/dL (ref 0.61–1.24)
GFR, Estimated: >60 mL/min (ref >60)
Glucose, Bld: 96 mg/dL (ref 70–99)
Potassium: 3.1 mmol/L — ABNORMAL LOW (ref 3.5–5.1)
Sodium: 138 mmol/L (ref 135–145)

## 2020-07-05 LAB — MAGNESIUM: Magnesium: 1.8 mg/dL (ref 1.7–2.4)

## 2020-07-05 MED ORDER — POTASSIUM CHLORIDE CRYS ER 20 MEQ PO TBCR
40.0000 meq | EXTENDED_RELEASE_TABLET | Freq: Once | ORAL | Status: AC
  Administered 2020-07-05: 40 meq via ORAL
  Filled 2020-07-05: qty 2

## 2020-07-05 NOTE — Plan of Care (Signed)
Alert and oriented x 4. Denies pain or discomfort. Continues on enteric precaution pending results. Reports loose stools still. Independent with adls. IV fluids running and tolerated well. Will continue plan of care. Problem: Education: Goal: Knowledge of General Education information will improve Description: Including pain rating scale, medication(s)/side effects and non-pharmacologic comfort measures Outcome: Progressing   Problem: Health Behavior/Discharge Planning: Goal: Ability to manage health-related needs will improve Outcome: Progressing   Problem: Clinical Measurements: Goal: Ability to maintain clinical measurements within normal limits will improve Outcome: Progressing Goal: Will remain free from infection Outcome: Progressing Goal: Diagnostic test results will improve Outcome: Progressing Goal: Respiratory complications will improve Outcome: Progressing Goal: Cardiovascular complication will be avoided Outcome: Progressing   Problem: Activity: Goal: Risk for activity intolerance will decrease Outcome: Progressing   Problem: Nutrition: Goal: Adequate nutrition will be maintained Outcome: Progressing   Problem: Coping: Goal: Level of anxiety will decrease Outcome: Progressing   Problem: Elimination: Goal: Will not experience complications related to bowel motility Outcome: Progressing Goal: Will not experience complications related to urinary retention Outcome: Progressing   Problem: Pain Managment: Goal: General experience of comfort will improve Outcome: Progressing   Problem: Safety: Goal: Ability to remain free from injury will improve Outcome: Progressing   Problem: Skin Integrity: Goal: Risk for impaired skin integrity will decrease Outcome: Progressing

## 2020-07-05 NOTE — Discharge Summary (Signed)
Physician Discharge Summary  [name] NOI:[id number] DOB: Jun 14, 1965 DOA: 07/03/2020  PCP: [name], MD  Admit date: 07/03/2020 Discharge date: 07/05/2020  Admitted From:  Home  Disposition: Home   Recommendations for Outpatient Follow-up:  1. Follow up with PCP in 2 weeks 2. Please obtain BMP in 2 weeks   Discharge Condition: STABLE   CODE STATUS: FULL DIET: soft foods    Brief Hospitalization Summary: Please see all hospital notes, images, labs for full details of the hospitalization. ADMISSION HPI:  55 y.o. male, with HTN, GERD, anxiety, and more presents to the ED with a chief complaint of n/v/d, abdominal pain, and muscle cramping that started last night. Patient reports that he has had symptoms constantly since they started last night. He had too many episodes to count. No hematemesis, hematochezia, or melena. Patient denies fevers and chills. He reports that his last normal meal was yesterday during the day. He reports that his abdominal pain is muscular in nature and he attributes it to heaving associated with vomiting. This AM, his BL calves started cramping severely. He was drinking water and gatorade as much as he could, and it would come back up as soon as he drank it. His diarrhea was so often that he became incontinent, so he decided he had better come to the hospital.   HOSPITAL COURSE   Acute Gastroenteritis / Rotavirus Gastroenteritis - Pt reports that he is feeling better and his diarrhea has resolved after receiving supportive therapy in the hospital.    AKI - prerenal as patient was severely dehydrated and taking diuretics.  This has resolved with IV fluid therapy.  Creatinine has normalized.   Essential hypertension - Well controlled. Hold chlorthalidone and lisinopril for now as BP does not require.  Continue amlodipine 5 mg daily.   Hypokalemia - he was total body depleted likely from taking chlorthalidone.  This has been stopped.  He was given potassium  supplementation.  Plan to repeat BMP outpatient with PCP in 2 weeks.     Discharge Diagnoses:  Principal Problem:   AKI (acute kidney injury) ([place]) Active Problems:   Essential hypertension   Dehydration   Hypokalemia   Anxiety   Acute renal failure (ARF) ([place])   Discharge Instructions:  Allergies as of 07/05/2020      Reactions   Codeine Nausea Only, Swelling, Other (See Comments)   Reaction=swelling,chest tightness,itching,nausea   Percocet [oxycodone-acetaminophen] Itching, Nausea Only, Swelling, Other (See Comments)   Reaction=chest tightness      Medication List    STOP taking these medications   chlorthalidone 25 MG tablet Commonly known as: HYGROTON   ibuprofen 800 MG tablet Commonly known as: ADVIL   lisinopril 40 MG tablet Commonly known as: ZESTRIL   Olopatadine HCl 0.7 % Soln   Restasis Multidose 0.05 % ophthalmic emulsion Generic drug: cycloSPORINE     TAKE these medications   ALPRAZolam 0.5 MG tablet Commonly known as: XANAX Take 0.5 mg by mouth 2 (two) times daily as needed. For anxiety   amLODipine 5 MG tablet Commonly known as: NORVASC Take 5 mg by mouth every evening.   aspirin 81 MG EC tablet Take 81 mg by mouth daily.   diclofenac Sodium 1 % Gel Commonly known as: VOLTAREN Apply 3 g topically 4 (four) times daily.   pantoprazole 40 MG tablet Commonly known as: PROTONIX Take 40 mg by mouth every morning.   Vitamin D3 50 MCG (2000 UT) Tabs Take 1 tablet by mouth daily.  zinc gluconate 50 MG tablet Take 50 mg by mouth daily.   zolpidem 10 MG tablet Commonly known as: AMBIEN Take 10 mg by mouth at bedtime as needed for sleep.       Follow-up Information    [name], MD. Schedule an appointment as soon as possible for a visit in 2 week(s).   Specialty: Internal Medicine Contact information: [address] [place] [address] [phone]              Allergies  Allergen Reactions  . Codeine Nausea Only, Swelling  and Other (See Comments)    Reaction=swelling,chest tightness,itching,nausea  . Percocet [Oxycodone-Acetaminophen] Itching, Nausea Only, Swelling and Other (See Comments)    Reaction=chest tightness   Allergies as of 07/05/2020      Reactions   Codeine Nausea Only, Swelling, Other (See Comments)   Reaction=swelling,chest tightness,itching,nausea   Percocet [oxycodone-acetaminophen] Itching, Nausea Only, Swelling, Other (See Comments)   Reaction=chest tightness      Medication List    STOP taking these medications   chlorthalidone 25 MG tablet Commonly known as: HYGROTON   ibuprofen 800 MG tablet Commonly known as: ADVIL   lisinopril 40 MG tablet Commonly known as: ZESTRIL   Olopatadine HCl 0.7 % Soln   Restasis Multidose 0.05 % ophthalmic emulsion Generic drug: cycloSPORINE     TAKE these medications   ALPRAZolam 0.5 MG tablet Commonly known as: XANAX Take 0.5 mg by mouth 2 (two) times daily as needed. For anxiety   amLODipine 5 MG tablet Commonly known as: NORVASC Take 5 mg by mouth every evening.   aspirin 81 MG EC tablet Take 81 mg by mouth daily.   diclofenac Sodium 1 % Gel Commonly known as: VOLTAREN Apply 3 g topically 4 (four) times daily.   pantoprazole 40 MG tablet Commonly known as: PROTONIX Take 40 mg by mouth every morning.   Vitamin D3 50 MCG (2000 UT) Tabs Take 1 tablet by mouth daily.   zinc gluconate 50 MG tablet Take 50 mg by mouth daily.   zolpidem 10 MG tablet Commonly known as: AMBIEN Take 10 mg by mouth at bedtime as needed for sleep.       Procedures/Studies: DG Abd 1 View  Result Date: 07/03/2020 CLINICAL DATA:  Nausea with vomiting, diarrhea and muscle cramping for 24 hours. EXAM: ABDOMEN - 1 VIEW COMPARISON:  None available. Report only from abdominal CT 09/04/2014. FINDINGS: The bowel gas pattern is normal. There is no evidence of free intraperitoneal air or bowel wall thickening. Cholecystectomy clips are noted. No suspicious  abdominal calcifications are seen. There are mild degenerative changes in the thoracolumbar spine. IMPRESSION: No radiographic evidence of active abdominal process. Electronically Signed   By: Richardean Sale M.D.   On: 07/03/2020 20:26      Subjective:   Discharge Exam: Vitals:   07/05/20 0427 07/05/20 0742  BP: 116/65 129/78  Pulse: 90 71  Resp: 16 16  Temp: 99.2 F (37.3 C) 98.1 F (36.7 C)  SpO2: 100% 94%   General: Pt is alert, awake, not in acute distress Cardiovascular: normal S1/S2 +, no rubs, no gallops Respiratory: CTA bilaterally, no wheezing, no rhonchi Abdominal: Soft, NT, ND, bowel sounds + Extremities: no edema, no cyanosis   The results of significant diagnostics from this hospitalization (including imaging, microbiology, ancillary and laboratory) are listed below for reference.     Microbiology: Recent Results (from the past 240 hour(s))  Resp Panel by RT-PCR (Flu A&B, Covid) Nasopharyngeal Swab  Status: None   Collection Time: 07/03/20  6:30 PM   Specimen: Nasopharyngeal Swab; Nasopharyngeal(NP) swabs in vial transport medium  Result Value Ref Range Status   SARS Coronavirus 2 by RT PCR NEGATIVE NEGATIVE Final    Comment: (NOTE) SARS-CoV-2 target nucleic acids are NOT DETECTED.  The SARS-CoV-2 RNA is generally detectable in upper respiratory specimens during the acute phase of infection. The lowest concentration of SARS-CoV-2 viral copies this assay can detect is 138 copies/mL. A negative result does not preclude SARS-Cov-2 infection and should not be used as the sole basis for treatment or other patient management decisions. A negative result may occur with  improper specimen collection/handling, submission of specimen other than nasopharyngeal swab, presence of viral mutation(s) within the areas targeted by this assay, and inadequate number of viral copies(<138 copies/mL). A negative result must be combined with clinical observations, patient  history, and epidemiological information. The expected result is Negative.  Fact Sheet for Patients:  EntrepreneurPulse.com.au  Fact Sheet for Healthcare Providers:  IncredibleEmployment.be  This test is no t yet approved or cleared by the [place] FDA and  has been authorized for detection and/or diagnosis of SARS-CoV-2 by FDA under an Emergency Use Authorization (EUA). This EUA will remain  in effect (meaning this test can be used) for the duration of the COVID-19 declaration under Section 564(b)(1) of the Act, 21 U.S.C.section 360bbb-3(b)(1), unless the authorization is terminated  or revoked sooner.       Influenza A by PCR NEGATIVE NEGATIVE Final   Influenza B by PCR NEGATIVE NEGATIVE Final    Comment: (NOTE) The Xpert Xpress SARS-CoV-2/FLU/RSV plus assay is intended as an aid in the diagnosis of influenza from Nasopharyngeal swab specimens and should not be used as a sole basis for treatment. Nasal washings and aspirates are unacceptable for Xpert Xpress SARS-CoV-2/FLU/RSV testing.  Fact Sheet for Patients: EntrepreneurPulse.com.au  Fact Sheet for Healthcare Providers: IncredibleEmployment.be  This test is not yet approved or cleared by the [place] FDA and has been authorized for detection and/or diagnosis of SARS-CoV-2 by FDA under an Emergency Use Authorization (EUA). This EUA will remain in effect (meaning this test can be used) for the duration of the COVID-19 declaration under Section 564(b)(1) of the Act, 21 U.S.C. section 360bbb-3(b)(1), unless the authorization is terminated or revoked.  Performed at [facility], [address], [place], [place] [address]   MRSA PCR Screening     Status: None   Collection Time: 07/04/20  2:18 AM   Specimen: Nasal Mucosa; Nasopharyngeal  Result Value Ref Range Status   MRSA by PCR NEGATIVE NEGATIVE Final    Comment:        The GeneXpert MRSA  Assay (FDA approved for NASAL specimens only), is one component of a comprehensive MRSA colonization surveillance program. It is not intended to diagnose MRSA infection nor to guide or monitor treatment for MRSA infections. Performed at [facility], [address] [place], [place] [address]   C Difficile Quick Screen w PCR reflex     Status: None   Collection Time: 07/04/20  7:31 AM   Specimen: STOOL  Result Value Ref Range Status   C Diff antigen NEGATIVE NEGATIVE Final   C Diff toxin NEGATIVE NEGATIVE Final   C Diff interpretation No C. difficile detected.  Final    Comment: Performed at [facility], [address] [place],  [address]  Gastrointestinal Panel by PCR , Stool     Status: Abnormal   Collection Time:  07/04/20  7:32 AM   Specimen: STOOL  Result Value Ref Range Status   Campylobacter species NOT DETECTED NOT DETECTED Final   Plesimonas shigelloides NOT DETECTED NOT DETECTED Final   Salmonella species NOT DETECTED NOT DETECTED Final   Yersinia enterocolitica NOT DETECTED NOT DETECTED Final   Vibrio species NOT DETECTED NOT DETECTED Final   Vibrio cholerae NOT DETECTED NOT DETECTED Final   Enteroaggregative E coli (EAEC) NOT DETECTED NOT DETECTED Final   Enteropathogenic E coli (EPEC) NOT DETECTED NOT DETECTED Final   Enterotoxigenic E coli (ETEC) NOT DETECTED NOT DETECTED Final   Shiga like toxin producing E coli (STEC) NOT DETECTED NOT DETECTED Final   Shigella/Enteroinvasive E coli (EIEC) NOT DETECTED NOT DETECTED Final   Cryptosporidium NOT DETECTED NOT DETECTED Final   Cyclospora cayetanensis NOT DETECTED NOT DETECTED Final   Entamoeba histolytica NOT DETECTED NOT DETECTED Final   Giardia lamblia NOT DETECTED NOT DETECTED Final   Adenovirus F40/41 NOT DETECTED NOT DETECTED Final   Astrovirus NOT DETECTED NOT DETECTED Final   Norovirus GI/GII NOT DETECTED NOT DETECTED Final   Rotavirus A DETECTED (A) NOT DETECTED Final   Sapovirus (I, II, IV, and V)  NOT DETECTED NOT DETECTED Final    Comment: Performed at [facility], [address] [place], [place] [address]     Labs: BNP (last 3 results) No results for input(s): BNP in the last 8760 hours. Basic Metabolic Panel: Recent Labs  Lab 07/03/20 1839 07/04/20 0521 07/05/20 0601  NA 138 140 138  K 3.3* 2.8* 3.1*  CL 101 107 104  CO2 22 20* 25  GLUCOSE 137* 97 96  BUN 38* 40* 23*  CREATININE 2.11* 1.02 0.89  CALCIUM 10.0 8.6* 8.5*  MG 2.4  --  1.8   Liver Function Tests: Recent Labs  Lab 07/03/20 1839 07/04/20 0521  AST 37 34  ALT 74* 54*  ALKPHOS 107 76  BILITOT 1.0 0.8  PROT 9.8* 7.0  ALBUMIN 5.8* 4.1   Recent Labs  Lab 07/03/20 1839  LIPASE 27   No results for input(s): AMMONIA in the last 168 hours. CBC: Recent Labs  Lab 07/03/20 1839 07/04/20 0521  WBC 10.8* 8.4  NEUTROABS 9.4* 6.5  HGB 21.4* 17.3*  HCT 62.0* 50.4  MCV 87.2 87.0  PLT 239 173   Cardiac Enzymes: No results for input(s): CKTOTAL, CKMB, CKMBINDEX, TROPONINI in the last 168 hours. BNP: Invalid input(s): POCBNP CBG: No results for input(s): GLUCAP in the last 168 hours. D-Dimer No results for input(s): DDIMER in the last 72 hours. Hgb A1c No results for input(s): HGBA1C in the last 72 hours. Lipid Profile No results for input(s): CHOL, HDL, LDLCALC, TRIG, CHOLHDL, LDLDIRECT in the last 72 hours. Thyroid function studies No results for input(s): TSH, T4TOTAL, T3FREE, THYROIDAB in the last 72 hours.  Invalid input(s): FREET3 Anemia work up No results for input(s): VITAMINB12, FOLATE, FERRITIN, TIBC, IRON, RETICCTPCT in the last 72 hours. Urinalysis    Component Value Date/Time   COLORURINE YELLOW 07/03/2020 0037   APPEARANCEUR CLOUDY (A) 07/03/2020 0037   LABSPEC 1.020 07/03/2020 0037   PHURINE 5.0 07/03/2020 0037   GLUCOSEU NEGATIVE 07/03/2020 0037   HGBUR SMALL (A) 07/03/2020 0037   BILIRUBINUR NEGATIVE 07/03/2020 0037   KETONESUR NEGATIVE 07/03/2020 0037    PROTEINUR 100 (A) 07/03/2020 0037   UROBILINOGEN 1.0 09/04/2013 1324   NITRITE NEGATIVE 07/03/2020 0037   LEUKOCYTESUR NEGATIVE 07/03/2020 0037   Sepsis Labs Invalid input(s): PROCALCITONIN,  WBC,  [place] Microbiology Recent Results (from the past 240 hour(s))  Resp Panel by RT-PCR (Flu A&B, Covid) Nasopharyngeal Swab     Status: None   Collection Time: 07/03/20  6:30 PM   Specimen: Nasopharyngeal Swab; Nasopharyngeal(NP) swabs in vial transport medium  Result Value Ref Range Status   SARS Coronavirus 2 by RT PCR NEGATIVE NEGATIVE Final    Comment: (NOTE) SARS-CoV-2 target nucleic acids are NOT DETECTED.  The SARS-CoV-2 RNA is generally detectable in upper respiratory specimens during the acute phase of infection. The lowest concentration of SARS-CoV-2 viral copies this assay can detect is 138 copies/mL. A negative result does not preclude SARS-Cov-2 infection and should not be used as the sole basis for treatment or other patient management decisions. A negative result may occur with  improper specimen collection/handling, submission of specimen other than nasopharyngeal swab, presence of viral mutation(s) within the areas targeted by this assay, and inadequate number of viral copies(<138 copies/mL). A negative result must be combined with clinical observations, patient history, and epidemiological information. The expected result is Negative.  Fact Sheet for Patients:  EntrepreneurPulse.com.au  Fact Sheet for Healthcare Providers:  IncredibleEmployment.be  This test is no t yet approved or cleared by the [place] FDA and  has been authorized for detection and/or diagnosis of SARS-CoV-2 by FDA under an Emergency Use Authorization (EUA). This EUA will remain  in effect (meaning this test can be used) for the duration of the COVID-19 declaration under Section 564(b)(1) of the Act, 21 U.S.C.section 360bbb-3(b)(1), unless the  authorization is terminated  or revoked sooner.       Influenza A by PCR NEGATIVE NEGATIVE Final   Influenza B by PCR NEGATIVE NEGATIVE Final    Comment: (NOTE) The Xpert Xpress SARS-CoV-2/FLU/RSV plus assay is intended as an aid in the diagnosis of influenza from Nasopharyngeal swab specimens and should not be used as a sole basis for treatment. Nasal washings and aspirates are unacceptable for Xpert Xpress SARS-CoV-2/FLU/RSV testing.  Fact Sheet for Patients: EntrepreneurPulse.com.au  Fact Sheet for Healthcare Providers: IncredibleEmployment.be  This test is not yet approved or cleared by the [place] FDA and has been authorized for detection and/or diagnosis of SARS-CoV-2 by FDA under an Emergency Use Authorization (EUA). This EUA will remain in effect (meaning this test can be used) for the duration of the COVID-19 declaration under Section 564(b)(1) of the Act, 21 U.S.C. section 360bbb-3(b)(1), unless the authorization is terminated or revoked.  Performed at [facility], [address] [place], [place] [address]   MRSA PCR Screening     Status: None   Collection Time: 07/04/20  2:18 AM   Specimen: Nasal Mucosa; Nasopharyngeal  Result Value Ref Range Status   MRSA by PCR NEGATIVE NEGATIVE Final    Comment:        The GeneXpert MRSA Assay (FDA approved for NASAL specimens only), is one component of a comprehensive MRSA colonization surveillance program. It is not intended to diagnose MRSA infection nor to guide or monitor treatment for MRSA infections. Performed at [facility], [address], [place], [place] [address]   C Difficile Quick Screen w PCR reflex     Status: None   Collection Time: 07/04/20  7:31 AM   Specimen: STOOL  Result Value Ref Range Status   C Diff antigen NEGATIVE NEGATIVE Final   C Diff toxin NEGATIVE NEGATIVE Final   C Diff interpretation No C. difficile detected.  Final    Comment: Performed at  [facility],  [address], [place], [place] [address]  Gastrointestinal Panel by PCR , Stool     Status: Abnormal   Collection Time: 07/04/20  7:32 AM   Specimen: STOOL  Result Value Ref Range Status   Campylobacter species NOT DETECTED NOT DETECTED Final   Plesimonas shigelloides NOT DETECTED NOT DETECTED Final   Salmonella species NOT DETECTED NOT DETECTED Final   Yersinia enterocolitica NOT DETECTED NOT DETECTED Final   Vibrio species NOT DETECTED NOT DETECTED Final   Vibrio cholerae NOT DETECTED NOT DETECTED Final   Enteroaggregative E coli (EAEC) NOT DETECTED NOT DETECTED Final   Enteropathogenic E coli (EPEC) NOT DETECTED NOT DETECTED Final   Enterotoxigenic E coli (ETEC) NOT DETECTED NOT DETECTED Final   Shiga like toxin producing E coli (STEC) NOT DETECTED NOT DETECTED Final   Shigella/Enteroinvasive E coli (EIEC) NOT DETECTED NOT DETECTED Final   Cryptosporidium NOT DETECTED NOT DETECTED Final   Cyclospora cayetanensis NOT DETECTED NOT DETECTED Final   Entamoeba histolytica NOT DETECTED NOT DETECTED Final   Giardia lamblia NOT DETECTED NOT DETECTED Final   Adenovirus F40/41 NOT DETECTED NOT DETECTED Final   Astrovirus NOT DETECTED NOT DETECTED Final   Norovirus GI/GII NOT DETECTED NOT DETECTED Final   Rotavirus A DETECTED (A) NOT DETECTED Final   Sapovirus (I, II, IV, and V) NOT DETECTED NOT DETECTED Final    Comment: Performed at [facility], [address] [place], [place] [address]    Time coordinating discharge:  35 mins   SIGNED:  Irwin Brakeman, MD  [facility] 07/05/2020, 11:18 AM How to contact the [facility] Attending or Consulting provider 7A - 7P or covering provider during after hours 7P -7A, for this patient?  1. Check the care team in [facility] [place] [facility] and look for a) attending/consulting [facility] provider listed and b) the [facility] team listed 2. Log into [email or web address] and use [facility] [place]'s universal password to access. If you do not have the password, please  contact the hospital operator. 3. Locate the [facility] provider you are looking for under [facility] and page to a number that you can be directly reached. 4. If you still have difficulty reaching the provider, please page the [facility] (Director on Call) for the Hospitalists listed on amion for assistance.

## 2020-07-05 NOTE — Discharge Instructions (Signed)
Abdominal Pain, Adult Many things can cause belly (abdominal) pain. Most times, belly pain is not dangerous. Many cases of belly pain can be watched and treated at home. Sometimes, though, belly pain is serious. Your doctor will try to find the cause of your belly pain. Follow these instructions at home: Medicines  Take over-the-counter and prescription medicines only as told by your doctor.  Do not take medicines that help you poop (laxatives) unless told by your doctor. General instructions  Watch your belly pain for any changes.  Drink enough fluid to keep your pee (urine) pale yellow.  Keep all follow-up visits as told by your doctor. This is important.   Contact a doctor if:  Your belly pain changes or gets worse.  You are not hungry, or you lose weight without trying.  You are having trouble pooping (constipated) or have watery poop (diarrhea) for more than 2-3 days.  You have pain when you pee or poop.  Your belly pain wakes you up at night.  Your pain gets worse with meals, after eating, or with certain foods.  You are vomiting and cannot keep anything down.  You have a fever.  You have blood in your pee. Get help right away if:  Your pain does not go away as soon as your doctor says it should.  You cannot stop vomiting.  Your pain is only in areas of your belly, such as the right side or the left lower part of the belly.  You have bloody or black poop, or poop that looks like tar.  You have very bad pain, cramping, or bloating in your belly.  You have signs of not having enough fluid or water in your body (dehydration), such as: ? Dark pee, very little pee, or no pee. ? Cracked lips. ? Dry mouth. ? Sunken eyes. ? Sleepiness. ? Weakness.  You have trouble breathing or chest pain. Summary  Many cases of belly pain can be watched and treated at home.  Watch your belly pain for any changes.  Take over-the-counter and prescription medicines only as  told by your doctor.  Contact a doctor if your belly pain changes or gets worse.  Get help right away if you have very bad pain, cramping, or bloating in your belly. This information is not intended to replace advice given to you by your health care provider. Make sure you discuss any questions you have with your health care provider. Document Revised: 07/30/2018 Document Reviewed: 07/30/2018 Elsevier Patient Education  2021 [place]. Acute Kidney Injury, Adult  Acute kidney injury is a sudden worsening of kidney function. The kidneys are organs that have several jobs. They filter the blood to remove waste products and extra fluid. They also maintain a healthy balance of minerals and hormones in the body, which helps control blood pressure and keep bones strong. With this condition, your kidneys do not do their jobs as well as they should. This condition ranges from mild to severe. Over time, it may develop into long-lasting (chronic) kidney disease. Early detection and treatment may prevent acute kidney injury from developing into a chronic condition. What are the causes? Common causes of this condition include:  A problem with blood flow to the kidneys. This may be caused by: ? Low blood pressure (hypotension) or shock. ? Blood loss. ? Heart and blood vessel (cardiovascular) disease. ? Severe burns. ? Liver disease.  Direct damage to the kidneys. This may be caused by: ? Certain medicines. ?  A kidney infection. ? Poisoning. ? Being around or in contact with toxic substances. ? A surgical wound. ? A hard, direct hit to the kidney area.  A sudden blockage of urine flow. This may be caused by: ? Cancer. ? Kidney stones. ? An enlarged prostate in males. What increases the risk? You are more likely to develop this condition if you:  Are older than age 54.  Are male.  Are hospitalized, especially if you are in critical condition.  Have certain conditions, such  as: ? Chronic kidney disease. ? Diabetes. ? Coronary artery disease and heart failure. ? Pulmonary disease. ? Chronic liver disease. What are the signs or symptoms? Symptoms of this condition may not be obvious until the condition becomes severe. Symptoms of this condition can include:  Tiredness (lethargy) or difficulty staying awake.  Nausea or vomiting.  Swelling (edema) of the face, legs, ankles, or feet.  Problems with urination, such as: ? Pain in the abdomen, or pain along the side of your stomach (flank). ? Producing little or no urine. ? Passing urine with a weak flow.  Muscle twitches and cramps, especially in the legs.  Confusion or trouble concentrating.  Loss of appetite.  Fever. How is this diagnosed? Your health care provider can diagnose this condition based on your symptoms, medical history, and a physical exam.  You may also have other tests, such as:  Blood tests.  Urine tests.  Imaging tests.  A test in which a sample of tissue is removed from the kidneys to be examined under a microscope (kidney biopsy). How is this treated? Treatment for this condition depends on the cause and how severe the condition is. In mild cases, treatment may not be needed. The kidneys may heal on their own. In more severe cases, treatment will involve:  Treating the cause of the kidney injury. This may involve changing any medicines you are taking or adjusting your dosage.  Fluids. You may need specialized IV fluids to balance your body's needs.  Having a catheter placed to drain urine and prevent blockages.  Preventing problems from occurring. This may mean avoiding certain medicines or procedures that can cause further injury to the kidneys. In some cases, treatment may also require:  A procedure to remove toxic wastes from the body (dialysis or continuous renal replacement therapy, CRRT).  Surgery. This may be done to repair a torn kidney or to remove the blockage  from the urinary system. Follow these instructions at home: Medicines  Take over-the-counter and prescription medicines only as told by your health care provider.  Do not take any new medicines without your health care provider's approval. Many medicines can worsen your kidney damage.  Do not take any vitamin and mineral supplements without your health care provider's approval. Many nutritional supplements can worsen your kidney damage. Lifestyle  If your health care provider prescribed changes to your diet, follow them. You may need to decrease the amount of protein you eat.  Achieve and maintain a healthy weight. If you need help with this, ask your health care provider.  Start or continue an exercise plan. Try to exercise at least 30 minutes a day, 5 days a week.  Do not use any products that contain nicotine or tobacco, such as cigarettes, e-cigarettes, and chewing tobacco. If you need help quitting, ask your health care provider.   General instructions  Keep track of your blood pressure. Report changes in your blood pressure as told by your health care provider.  Stay up to date with your vaccines. Ask your health care provider which vaccines you need.  Keep all follow-up visits as told by your health care provider. This is important.   Where to find more information  American Association of Kidney Patients: BombTimer.gl  National Kidney Foundation: www.kidney.[place]: https://mathis.com/  [facility]: ? www.lifeoptions.org ? www.kidneyschool.org Contact a health care provider if:  Your symptoms get worse.  You develop new symptoms. Get help right away if:  You develop symptoms of worsening kidney disease, which include: ? Headaches. ? Abnormally dark or light skin. ? Easy bruising. ? Frequent hiccups. ? Chest pain. ? Shortness of breath. ? End of menstruation in women. ? Seizures. ? Confusion or altered mental  status. ? Abdominal or back pain. ? Itchiness.  You have a fever.  Your body is producing less urine.  You have pain or bleeding when you urinate. Summary  Acute kidney injury is a sudden worsening of kidney function.  Acute kidney injury can be caused by problems with blood flow to the kidneys, direct damage to the kidneys, and sudden blockage of urine flow.  Symptoms of this condition may not be obvious until it becomes severe. Symptoms may include edema, lethargy, confusion, nausea or vomiting, and problems passing urine.  This condition can be diagnosed with blood tests, urine tests, and imaging tests. Sometimes a kidney biopsy is done to diagnose this condition.  Treatment for this condition often involves treating the underlying cause. It is treated with fluids, medicines, diet changes, dialysis, or surgery. This information is not intended to replace advice given to you by your health care provider. Make sure you discuss any questions you have with your health care provider. Document Revised: 01/29/2019 Document Reviewed: 01/29/2019 Elsevier Patient Education  2021 Elsevier Inc.    IMPORTANT INFORMATION: PAY CLOSE ATTENTION   PHYSICIAN DISCHARGE INSTRUCTIONS  Follow with Primary care provider  [name], MD  and other consultants as instructed by your Hospitalist Physician  [place] IF SYMPTOMS COME BACK, WORSEN OR NEW PROBLEM DEVELOPS   Please note: You were cared for by a hospitalist during your hospital stay. Every effort will be made to forward records to your primary care provider.  You can request that your primary care provider send for your hospital records if they have not received them.  Once you are discharged, your primary care physician will handle any further medical issues. Please note that NO REFILLS for any discharge medications will be authorized once you are discharged, as it is imperative that you return to your  primary care physician (or establish a relationship with a primary care physician if you do not have one) for your post hospital discharge needs so that they can reassess your need for medications and monitor your lab values.  Please get a complete blood count and chemistry panel checked by your Primary MD at your next visit, and again as instructed by your Primary MD.  Get Medicines reviewed and adjusted: Please take all your medications with you for your next visit with your Primary MD  Laboratory/radiological data: Please request your Primary MD to go over all hospital tests and procedure/radiological results at the follow up, please ask your primary care provider to get all Hospital records sent to his/her office.  In some cases, they will be blood work, cultures and biopsy results pending at the time of your discharge. Please request that your primary care provider  follow up on these results.  If you are diabetic, please bring your blood sugar readings with you to your follow up appointment with primary care.    Please call and make your follow up appointments as soon as possible.    Also Note the following: If you experience worsening of your admission symptoms, develop shortness of breath, life threatening emergency, suicidal or homicidal thoughts you must seek medical attention immediately by calling 911 or calling your MD immediately  if symptoms less severe.  You must read complete instructions/literature along with all the possible adverse reactions/side effects for all the Medicines you take and that have been prescribed to you. Take any new Medicines after you have completely understood and accpet all the possible adverse reactions/side effects.   Do not drive when taking Pain medications or sleeping medications (Benzodiazepines)  Do not take more than prescribed Pain, Sleep and Anxiety Medications. It is not advisable to combine anxiety,sleep and pain medications without talking  with your primary care practitioner  Special Instructions: If you have smoked or chewed Tobacco  in the last 2 yrs please stop smoking, stop any regular Alcohol  and or any Recreational drug use.  Wear Seat belts while driving.  Do not drive if taking any narcotic, mind altering or controlled substances or recreational drugs or alcohol.

## 2020-10-16 ENCOUNTER — Emergency Department (HOSPITAL_COMMUNITY)
Admission: EM | Admit: 2020-10-16 | Discharge: 2020-10-16 | Disposition: A | Discharge status: Home / Self Care | Payer: 59 | Attending: Emergency Medicine | Admitting: Emergency Medicine

## 2020-10-16 ENCOUNTER — Encounter (HOSPITAL_COMMUNITY): Payer: Self-pay | Admitting: *Deleted

## 2020-10-16 ENCOUNTER — Other Ambulatory Visit: Payer: Self-pay

## 2020-10-16 DIAGNOSIS — L03211 Cellulitis of face: Secondary | ICD-10-CM

## 2020-10-16 DIAGNOSIS — Z96652 Presence of left artificial knee joint: Secondary | ICD-10-CM | POA: Diagnosis not present

## 2020-10-16 DIAGNOSIS — R21 Rash and other nonspecific skin eruption: Secondary | ICD-10-CM | POA: Diagnosis present

## 2020-10-16 DIAGNOSIS — Z79899 Other long term (current) drug therapy: Secondary | ICD-10-CM | POA: Insufficient documentation

## 2020-10-16 DIAGNOSIS — Z7982 Long term (current) use of aspirin: Secondary | ICD-10-CM | POA: Diagnosis not present

## 2020-10-16 DIAGNOSIS — F17210 Nicotine dependence, cigarettes, uncomplicated: Secondary | ICD-10-CM | POA: Insufficient documentation

## 2020-10-16 DIAGNOSIS — I10 Essential (primary) hypertension: Secondary | ICD-10-CM | POA: Insufficient documentation

## 2020-10-16 DIAGNOSIS — L03818 Cellulitis of other sites: Secondary | ICD-10-CM | POA: Insufficient documentation

## 2020-10-16 MED ORDER — DOXYCYCLINE HYCLATE 100 MG PO CAPS: 100.0000 mg | ORAL_CAPSULE | Freq: Two times a day (BID) | ORAL | 0 refills | Status: AC

## 2020-10-16 NOTE — Discharge Instructions (Addendum)
You have an infection in the skin of your forehead, please take doxycycline twice a day exactly as prescribed, start on it today, you should seek medical exam in the emergency department for worsening swelling spreading swelling, rashes, fever or worsening vomiting.  Otherwise once the swelling goes down and the redness goes down you should see a dermatologist to have the small cyst removed.  Dr. Nevada Crane - Dermatology - [phone]  If his phone number no longer works for this office you may see the phone number and office location above for the [facility] office for Dr. Nevada Crane

## 2020-10-16 NOTE — ED Provider Notes (Signed)
[facility] EMERGENCY DEPARTMENT Provider Note   CSN: [id number] Arrival date & time: 10/16/20  1051     History Chief Complaint  Patient presents with   Wound Infection    Cadarius Nevares is a 55 y.o. male.  HPI  This patient is a 55 year old male, he has a history of nonalcoholic fatty liver disease acid reflux, he had been admitted to the hospital with acute kidney injury in April, otherwise he has been in his usual state of health until several days ago when he started having increasing redness of his forehead.  He states there is a nontender mobile skin lesion that is in the middle of his forehead that has been there for years, sometimes it gets red and inflamed which happened 3 days ago.  It is tender and is started to swell causing some swelling in the periorbital region bilaterally.  No fevers or chills nausea or vomiting or any other symptoms.  No medications prior to arrival  Past Medical History:  Diagnosis Date   Anxiety attack    Arthritis    Difficulty sleeping    Fatigue    Fatty liver disease, nonalcoholic    GERD (gastroesophageal reflux disease)    H/O hypokalemia    History of kidney stones    Hypertension    PONV (postoperative nausea and vomiting)    Splenomegaly     Patient Active Problem List   Diagnosis Date Noted   Acute renal failure (ARF) (Uncertain) 07/04/2020   AKI (acute kidney injury) ([place]) 07/03/2020   Dehydration 07/03/2020   Hypokalemia 07/03/2020   Anxiety 07/03/2020   Polyp of colon    Gastritis due to nonsteroidal anti-inflammatory drug    RUQ discomfort 12/23/2016   Colon cancer screening 12/23/2016   Encounter for long-term (current) use of NSAIDs 12/23/2016   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   Essential hypertension 11/03/2008    Past Surgical History:  Procedure Laterality Date   CHOLECYSTECTOMY     2003   COLONOSCOPY WITH PROPOFOL N/A 01/17/2017   Procedure: COLONOSCOPY WITH PROPOFOL;  Surgeon: Danie Binder, MD;  Location: [facility]  ENDO SUITE;  Service: Endoscopy;  Laterality: N/A;  10:45am   DENTAL SURGERY     ESOPHAGOGASTRODUODENOSCOPY (EGD) WITH PROPOFOL N/A 01/17/2017   Procedure: ESOPHAGOGASTRODUODENOSCOPY (EGD) WITH PROPOFOL;  Surgeon: Danie Binder, MD;  Location: [facility] ENDO SUITE;  Service: Endoscopy;  Laterality: N/A;   KNEE ARTHROSCOPY  2011 / 2012   left x2   LITHOTRIPSY  2011   PARTIAL KNEE ARTHROPLASTY  11/15/2011   Procedure: UNICOMPARTMENTAL KNEE;  Surgeon: Mauri Pole, MD;  Location: [facility] ORS;  Service: Orthopedics;  Laterality: Left;   PARTIAL KNEE ARTHROPLASTY Left 09/04/2013   Procedure: REVISION POLY LEFT UNICOMPARTMENTAL ARTHROPLASTY;  Surgeon: Mauri Pole, MD;  Location: [facility] ORS;  Service: Orthopedics;  Laterality: Left;   POLYPECTOMY  01/17/2017   Procedure: POLYPECTOMY;  Surgeon: Danie Binder, MD;  Location: [facility] ENDO SUITE;  Service: Endoscopy;;  Cecal and sigmoid colon(HS)       Family History  Problem Relation Age of Onset   Coronary artery disease Other    Aneurysm Sister    Stroke Sister    Colon polyps Neg Hx     Social History   Tobacco Use   Smoking status: Every Day    Packs/day: 1.00    Types: Cigarettes   Smokeless tobacco: Never  Substance Use Topics   Alcohol use: Yes    Comment: rare  Drug use: No    Home Medications Prior to Admission medications   Medication Sig Start Date End Date Taking? Authorizing Provider  doxycycline (VIBRAMYCIN) 100 MG capsule Take 1 capsule (100 mg total) by mouth 2 (two) times daily. 10/16/20  Yes Noemi Chapel, MD  ALPRAZolam Duanne Moron) 0.5 MG tablet Take 0.5 mg by mouth 2 (two) times daily as needed. For anxiety    [provider]  amLODipine (NORVASC) 5 MG tablet Take 5 mg by mouth every evening.     [provider]  aspirin 81 MG EC tablet Take 81 mg by mouth daily.    [provider]  Cholecalciferol (VITAMIN D3) 50 MCG (2000 UT) TABS Take 1 tablet by mouth daily.    [provider]  diclofenac  Sodium (VOLTAREN) 1 % GEL Apply 3 g topically 4 (four) times daily.    [provider]  pantoprazole (PROTONIX) 40 MG tablet Take 40 mg by mouth every morning. 12/09/16   [provider]  zinc gluconate 50 MG tablet Take 50 mg by mouth daily.    [provider]  zolpidem (AMBIEN) 10 MG tablet Take 10 mg by mouth at bedtime as needed for sleep.     [provider]    Allergies    Codeine and Percocet [oxycodone-acetaminophen]  Review of Systems   Review of Systems  Constitutional:  Negative for fever.  Skin:  Positive for rash.   Physical Exam Updated Vital Signs BP (!) 156/93 (BP Location: Right Arm)   Pulse 63   Temp 98.4 F (36.9 C) (Oral)   Resp 20   SpO2 97%   Physical Exam Vitals and nursing note reviewed.  Constitutional:      Appearance: He is well-developed. He is not diaphoretic.  HENT:     Head: Normocephalic and atraumatic.  Eyes:     General:        Right eye: No discharge.        Left eye: No discharge.     Conjunctiva/sclera: Conjunctivae normal.     Comments: Mild periorbital edema  Pulmonary:     Effort: Pulmonary effort is normal. No respiratory distress.  Skin:    General: Skin is warm and dry.     Findings: Erythema and rash present.     Comments: Forehead red - central small indurated area < 1/2 inch.  No fluctuance  Neurological:     General: No focal deficit present.     Mental Status: He is alert.     Coordination: Coordination normal.     Comments: Gait normal, speech normal    ED Results / Procedures / Treatments   Labs (all labs ordered are listed, but only abnormal results are displayed) Labs Reviewed - No data to display  EKG None  Radiology No results found.  Procedures Procedures   Medications Ordered in ED Medications - No data to display  ED Course  I have reviewed the triage vital signs and the nursing notes.  Pertinent labs & imaging results that were available during my care of the  patient were reviewed by me and considered in my medical decision making (see chart for details).    MDM Rules/Calculators/A&P                          Well appearing, early cellulitis - normla VS other than htn Pt agreeable to return for I  and D as needed - Derm outpt f/u.  Final Clinical Impression(s) / ED Diagnoses Final diagnoses:  Cellulitis of forehead    Rx / DC Orders ED Discharge Orders          Ordered    doxycycline (VIBRAMYCIN) 100 MG capsule  2 times daily        10/16/20 1110             Noemi Chapel, MD 10/16/20 1115

## 2020-10-16 NOTE — ED Triage Notes (Signed)
Infected pimple to forehead with swelling to eye area x 3 days

## 2021-02-22 ENCOUNTER — Other Ambulatory Visit: Payer: Self-pay

## 2021-02-22 ENCOUNTER — Emergency Department (HOSPITAL_COMMUNITY)
Admission: EM | Admit: 2021-02-22 | Discharge: 2021-02-22 | Disposition: A | Discharge status: Home / Self Care | Payer: 59 | Attending: Emergency Medicine | Admitting: Emergency Medicine

## 2021-02-22 ENCOUNTER — Emergency Department (HOSPITAL_COMMUNITY): Payer: 59

## 2021-02-22 DIAGNOSIS — F17210 Nicotine dependence, cigarettes, uncomplicated: Secondary | ICD-10-CM | POA: Diagnosis not present

## 2021-02-22 DIAGNOSIS — I10 Essential (primary) hypertension: Secondary | ICD-10-CM | POA: Diagnosis not present

## 2021-02-22 DIAGNOSIS — R042 Hemoptysis: Secondary | ICD-10-CM | POA: Diagnosis not present

## 2021-02-22 DIAGNOSIS — Z96652 Presence of left artificial knee joint: Secondary | ICD-10-CM | POA: Diagnosis not present

## 2021-02-22 DIAGNOSIS — R059 Cough, unspecified: Secondary | ICD-10-CM | POA: Diagnosis not present

## 2021-02-22 DIAGNOSIS — R1110 Vomiting, unspecified: Secondary | ICD-10-CM | POA: Insufficient documentation

## 2021-02-22 DIAGNOSIS — Z7982 Long term (current) use of aspirin: Secondary | ICD-10-CM | POA: Diagnosis not present

## 2021-02-22 DIAGNOSIS — Z20822 Contact with and (suspected) exposure to covid-19: Secondary | ICD-10-CM | POA: Diagnosis not present

## 2021-02-22 DIAGNOSIS — Z79899 Other long term (current) drug therapy: Secondary | ICD-10-CM | POA: Diagnosis not present

## 2021-02-22 DIAGNOSIS — J209 Acute bronchitis, unspecified: Secondary | ICD-10-CM

## 2021-02-22 LAB — RESP PANEL BY RT-PCR (FLU A&B, COVID) ARPGX2
Influenza A by PCR: NEGATIVE
Influenza B by PCR: NEGATIVE
SARS Coronavirus 2 by RT PCR: NEGATIVE

## 2021-02-22 LAB — CBC WITH DIFFERENTIAL/PLATELET
Abs Immature Granulocytes: 0.02 10*3/uL (ref 0.00–0.07)
Basophils Absolute: 0.1 10*3/uL (ref 0.0–0.1)
Basophils Relative: 1 %
Eosinophils Absolute: 0.2 10*3/uL (ref 0.0–0.5)
Eosinophils Relative: 2 %
HCT: 47.9 % (ref 39.0–52.0)
Hemoglobin: 17 g/dL (ref 13.0–17.0)
Immature Granulocytes: 0 %
Lymphocytes Relative: 30 %
Lymphs Abs: 2.4 10*3/uL (ref 0.7–4.0)
MCH: 30.2 pg (ref 26.0–34.0)
MCHC: 35.5 g/dL (ref 30.0–36.0)
MCV: 85.2 fL (ref 80.0–100.0)
Monocytes Absolute: 0.6 10*3/uL (ref 0.1–1.0)
Monocytes Relative: 7 %
Neutro Abs: 4.6 10*3/uL (ref 1.7–7.7)
Neutrophils Relative %: 60 %
Platelets: 209 10*3/uL (ref 150–400)
RBC: 5.62 MIL/uL (ref 4.22–5.81)
RDW: 12.7 % (ref 11.5–15.5)
WBC: 7.8 10*3/uL (ref 4.0–10.5)
nRBC: 0 % (ref 0.0–0.2)

## 2021-02-22 LAB — COMPREHENSIVE METABOLIC PANEL
ALT: 38 U/L (ref 0–44)
AST: 28 U/L (ref 15–41)
Albumin: 4.6 g/dL (ref 3.5–5.0)
Alkaline Phosphatase: 87 U/L (ref 38–126)
Anion gap: 10 (ref 5–15)
BUN: 16 mg/dL (ref 6–20)
CO2: 27 mmol/L (ref 22–32)
Calcium: 9.2 mg/dL (ref 8.9–10.3)
Chloride: 105 mmol/L (ref 98–111)
Creatinine, Ser: 0.68 mg/dL (ref 0.61–1.24)
GFR, Estimated: >60 mL/min (ref >60)
Glucose, Bld: 97 mg/dL (ref 70–99)
Potassium: 3.1 mmol/L — ABNORMAL LOW (ref 3.5–5.1)
Sodium: 142 mmol/L (ref 135–145)
Total Bilirubin: 0.7 mg/dL (ref 0.3–1.2)
Total Protein: 7.4 g/dL (ref 6.5–8.1)

## 2021-02-22 MED ORDER — ALBUTEROL SULFATE HFA 108 (90 BASE) MCG/ACT IN AERS: 2.0000 | INHALATION_SPRAY | Freq: Four times a day (QID) | RESPIRATORY_TRACT | 2 refills | Status: AC | PRN

## 2021-02-22 MED ORDER — IOHEXOL 350 MG/ML SOLN
100.0000 mL | Freq: Once | INTRAVENOUS | Status: AC | PRN
  Administered 2021-02-22: 100 mL via INTRAVENOUS

## 2021-02-22 MED ORDER — DOXYCYCLINE HYCLATE 100 MG PO CAPS: 100.0000 mg | ORAL_CAPSULE | Freq: Two times a day (BID) | ORAL | 0 refills | Status: AC

## 2021-02-22 NOTE — Discharge Instructions (Addendum)
Return if any problems.

## 2021-02-22 NOTE — ED Triage Notes (Signed)
Pt felt like tingling in through and had a productive cough. When he looked it was bright red blood. Pt states he has been coughing up blood x 1 hour. Denies any current pain.

## 2021-02-22 NOTE — ED Provider Notes (Signed)
[facility] EMERGENCY DEPARTMENT Provider Note   CSN: [id number] Arrival date & time: 02/22/21  1227     History Chief Complaint  Patient presents with   Cough   Emesis    [name] is a 55 y.o. male.  The history is provided by the patient.  Cough Cough characteristics:  Non-productive and productive Sputum characteristics:  Bloody Severity:  Moderate Onset quality:  Gradual Duration:  1 day Timing:  Constant Progression:  Worsening Chronicity:  New Smoker: no   Relieved by:  Nothing Worsened by:  Nothing Ineffective treatments:  None tried Associated symptoms: no shortness of breath       Past Medical History:  Diagnosis Date   Anxiety attack    Arthritis    Difficulty sleeping    Fatigue    Fatty liver disease, nonalcoholic    GERD (gastroesophageal reflux disease)    H/O hypokalemia    History of kidney stones    Hypertension    PONV (postoperative nausea and vomiting)    Splenomegaly     Patient Active Problem List   Diagnosis Date Noted   Acute renal failure (ARF) ([place]) 07/04/2020   AKI (acute kidney injury) ([place]) 07/03/2020   Dehydration 07/03/2020   Hypokalemia 07/03/2020   Anxiety 07/03/2020   Polyp of colon    Gastritis due to nonsteroidal anti-inflammatory drug    RUQ discomfort 12/23/2016   Colon cancer screening 12/23/2016   Encounter for long-term (current) use of NSAIDs 12/23/2016   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   Essential hypertension 11/03/2008    Past Surgical History:  Procedure Laterality Date   CHOLECYSTECTOMY     2003   COLONOSCOPY WITH PROPOFOL N/A 01/17/2017   Procedure: COLONOSCOPY WITH PROPOFOL;  Surgeon: Danie Binder, MD;  Location: [facility];  Service: Endoscopy;  Laterality: N/A;  10:45am   DENTAL SURGERY     ESOPHAGOGASTRODUODENOSCOPY (EGD) WITH PROPOFOL N/A 01/17/2017   Procedure: ESOPHAGOGASTRODUODENOSCOPY (EGD) WITH PROPOFOL;  Surgeon: Danie Binder, MD;  Location: [facility];  Service:  Endoscopy;  Laterality: N/A;   KNEE ARTHROSCOPY  2011 / 2012   left x2   LITHOTRIPSY  2011   PARTIAL KNEE ARTHROPLASTY  11/15/2011   Procedure: UNICOMPARTMENTAL KNEE;  Surgeon: Mauri Pole, MD;  Location: [facility];  Service: Orthopedics;  Laterality: Left;   PARTIAL KNEE ARTHROPLASTY Left 09/04/2013   Procedure: REVISION POLY LEFT UNICOMPARTMENTAL ARTHROPLASTY;  Surgeon: Mauri Pole, MD;  Location: [facility];  Service: Orthopedics;  Laterality: Left;   POLYPECTOMY  01/17/2017   Procedure: POLYPECTOMY;  Surgeon: Danie Binder, MD;  Location: [facility];  Service: Endoscopy;;  Cecal and sigmoid colon(HS)       Family History  Problem Relation Age of Onset   Coronary artery disease Other    Aneurysm Sister    Stroke Sister    Colon polyps Neg Hx     Social History   Tobacco Use   Smoking status: Every Day    Packs/day: 1.00    Types: Cigarettes   Smokeless tobacco: Never  Substance Use Topics   Alcohol use: Yes    Comment: rare   Drug use: No    Home Medications Prior to Admission medications   Medication Sig Start Date End Date Taking? Authorizing Provider  ALPRAZolam Duanne Moron) 0.5 MG tablet Take 0.5 mg by mouth 2 (two) times daily as needed. For anxiety    [provider]  amLODipine (NORVASC) 5 MG tablet Take 5  mg by mouth every evening.     [provider]  aspirin 81 MG EC tablet Take 81 mg by mouth daily.    [provider]  Cholecalciferol (VITAMIN D3) 50 MCG (2000 UT) TABS Take 1 tablet by mouth daily.    [provider]  diclofenac Sodium (VOLTAREN) 1 % GEL Apply 3 g topically 4 (four) times daily.    [provider]  doxycycline (VIBRAMYCIN) 100 MG capsule Take 1 capsule (100 mg total) by mouth 2 (two) times daily. 10/16/20   Noemi Chapel, MD  pantoprazole (PROTONIX) 40 MG tablet Take 40 mg by mouth every morning. 12/09/16   [provider]  zinc gluconate 50 MG tablet Take 50 mg by mouth daily.    [provider]  zolpidem (AMBIEN) 10 MG tablet Take 10 mg by mouth at bedtime as needed for sleep.     [provider]    Allergies    Codeine and Percocet [oxycodone-acetaminophen]  Review of Systems   Review of Systems  Respiratory:  Positive for cough. Negative for shortness of breath.   All other systems reviewed and are negative.  Physical Exam Updated Vital Signs BP (!) 181/105 (BP Location: Right Arm)   Pulse (!) 57   Temp 97.9 F (36.6 C) (Oral)   Resp 17   Ht 6\' 1"  (1.854 m)   Wt 104.3 kg   SpO2 98%   BMI 30.34 kg/m   Physical Exam Vitals and nursing note reviewed.  Constitutional:      General: He is not in acute distress.    Appearance: He is well-developed.  HENT:     Head: Normocephalic and atraumatic.  Eyes:     Conjunctiva/sclera: Conjunctivae normal.  Cardiovascular:     Rate and Rhythm: Normal rate and regular rhythm.     Heart sounds: No murmur heard. Pulmonary:     Effort: Pulmonary effort is normal. No respiratory distress.     Breath sounds: Normal breath sounds.  Abdominal:     Palpations: Abdomen is soft.     Tenderness: There is no abdominal tenderness.  Musculoskeletal:        General: No swelling.     Cervical back: Neck supple.  Skin:    General: Skin is warm and dry.     Capillary Refill: Capillary refill takes less than 2 seconds.  Neurological:     Mental Status: He is alert.  Psychiatric:        Mood and Affect: Mood normal.    ED Results / Procedures / Treatments   Labs (all labs ordered are listed, but only abnormal results are displayed) Labs Reviewed - No data to display  EKG None  Radiology No results found.  Procedures Procedures   Medications Ordered in ED Medications - No data to display  ED Course  I have reviewed the triage vital signs and the nursing notes.  Pertinent labs & imaging results that were available during my care of the patient were reviewed by me and considered in my medical  decision making (see chart for details).    MDM Rules/Calculators/A&P                           MDM:  chest xray no acute abnormality.  Labs and ct angio chest ordered.  Ct angio  no acute.  Pt given rx for doxycycline and albuterol   Final Clinical Impression(s) / ED Diagnoses Final diagnoses:  Hemoptysis    Rx / DC Orders ED Discharge Orders          Ordered    doxycycline (VIBRAMYCIN) 100 MG capsule  2 times daily        02/22/21 1948    albuterol (VENTOLIN HFA) 108 (90 Base) MCG/ACT inhaler  Every 6 hours PRN        02/22/21 1948          An After Visit Summary was printed and given to the patient.    Fransico Meadow, [place] 02/22/21 1954    Milton Ferguson, MD 02/23/21 1431

## 2022-01-10 DIAGNOSIS — I10 Essential (primary) hypertension: Secondary | ICD-10-CM | POA: Diagnosis not present

## 2022-01-10 DIAGNOSIS — J069 Acute upper respiratory infection, unspecified: Secondary | ICD-10-CM | POA: Diagnosis not present

## 2022-01-24 DIAGNOSIS — J069 Acute upper respiratory infection, unspecified: Secondary | ICD-10-CM | POA: Diagnosis not present

## 2022-01-24 DIAGNOSIS — E876 Hypokalemia: Secondary | ICD-10-CM | POA: Diagnosis not present

## 2022-11-23 IMAGING — CT CT ANGIO CHEST
2 of 6 series · 18 of 36 positions shown · IV contrast (omnipaque)
Comparison: None.

CLINICAL DATA: PE suspected, low/intermediate prob, positive
D-dimer. Pt felt like tingling in through and had a productive
cough. When he looked it was bright red blood. Pt states he has been
coughing up blood x 1 hour. Denies any current pain.

EXAM:
CT ANGIOGRAPHY CHEST WITH CONTRAST
TECHNIQUE: Multidetector CT imaging of the chest was performed using the
standard protocol during bolus administration of intravenous
contrast. Multiplanar CT image reconstructions and MIPs were
obtained to evaluate the vascular anatomy.
CONTRAST:  100mL OMNIPAQUE IOHEXOL 350 MG/ML SOLN

[Series 5: pe axial thins · axial · 0.79mm/px · z∈[+1336,+1684]mm · 17 of 481 slices shown]
[im 23/481  lung]
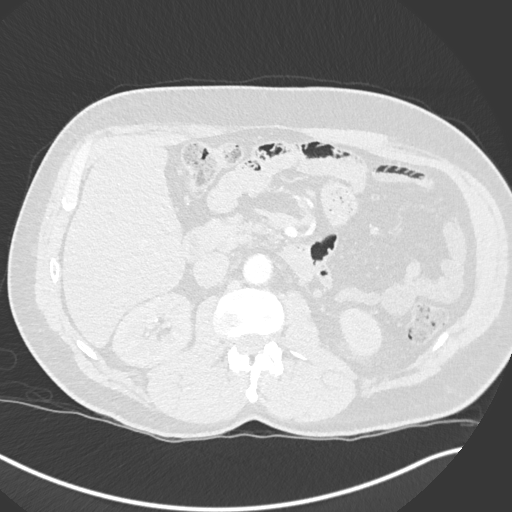
[im 46/481  mediastinal]
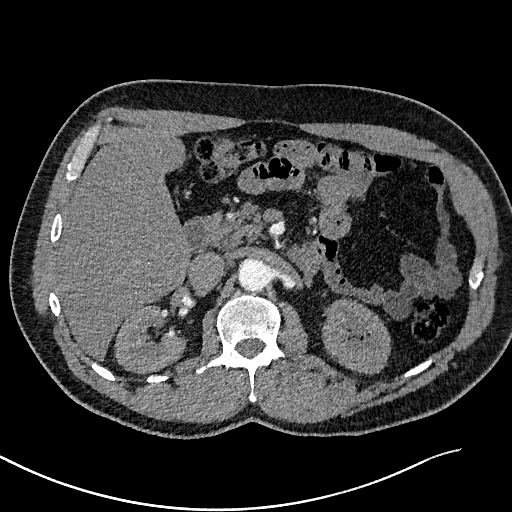
[im 69/481  lung]
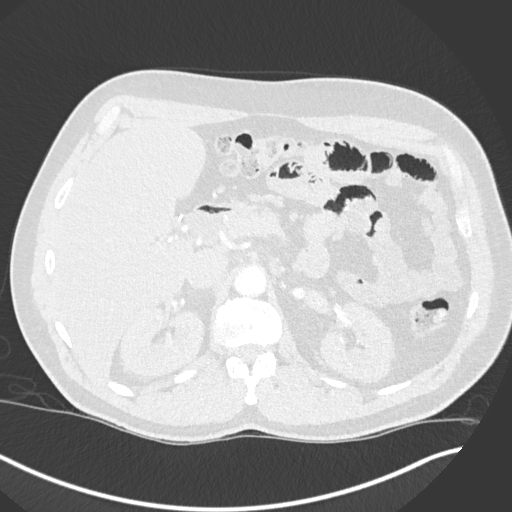
[im 115/481  mediastinal]
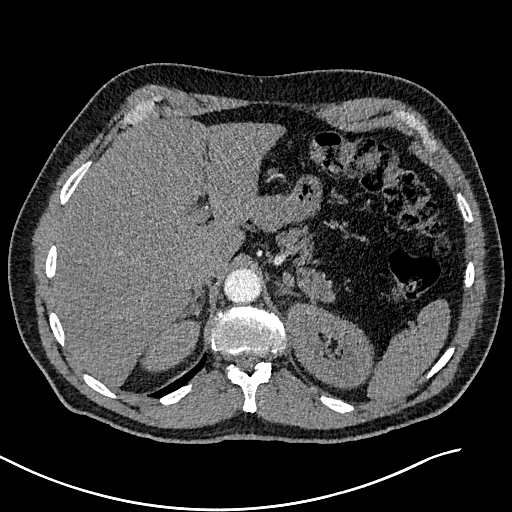
[im 138/481  lung]
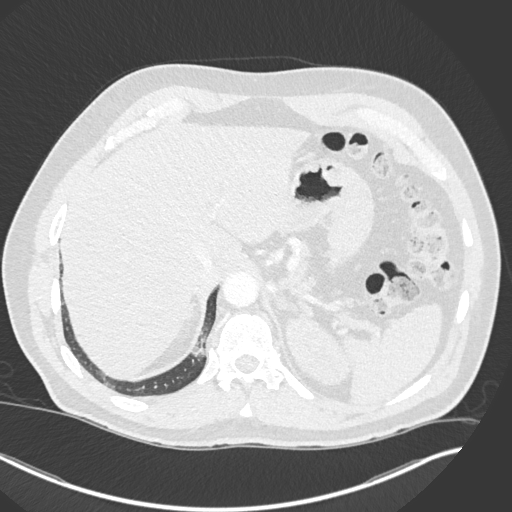
[im 161/481  mediastinal]
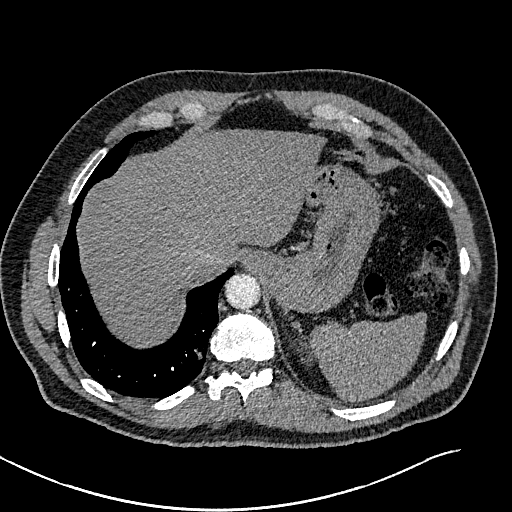
[im 183/481  lung]
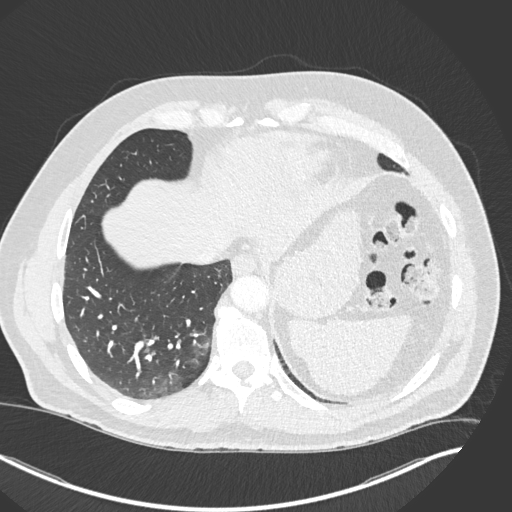
[im 206/481  mediastinal]
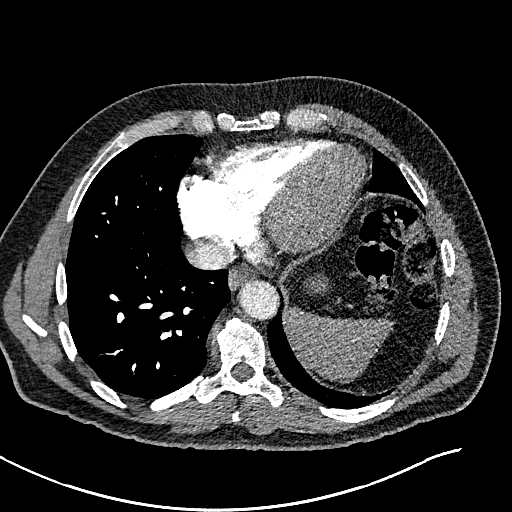
[im 252/481  lung]
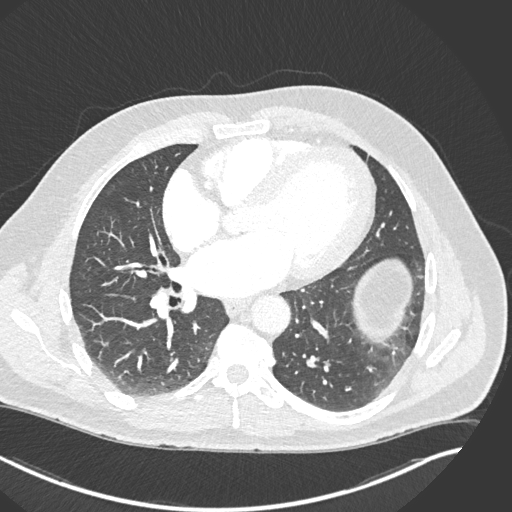
[im 275/481  mediastinal]
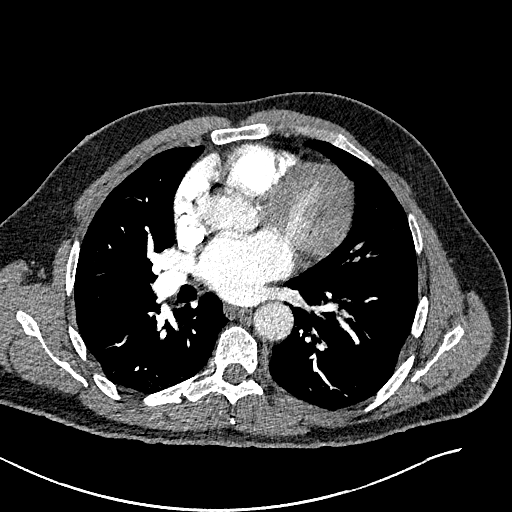
[im 298/481  lung]
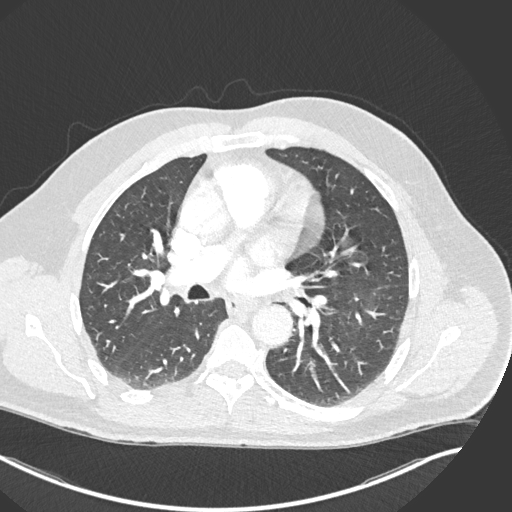
[im 321/481  mediastinal]
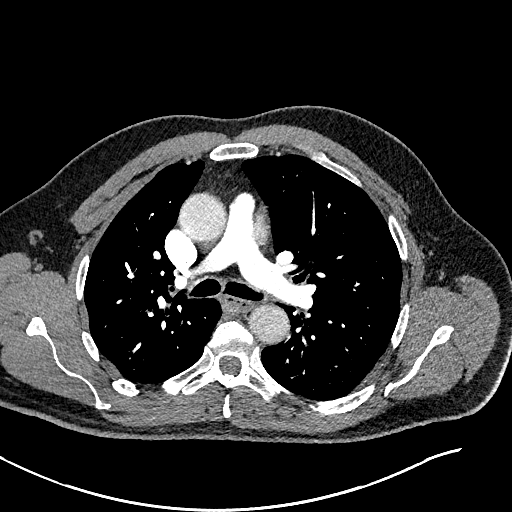
[im 343/481  lung]
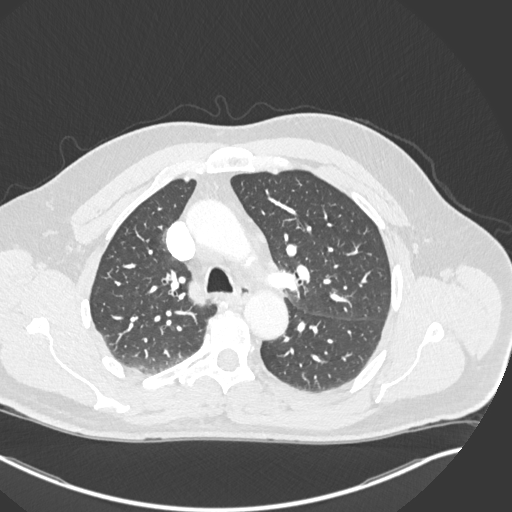
[im 366/481  mediastinal]
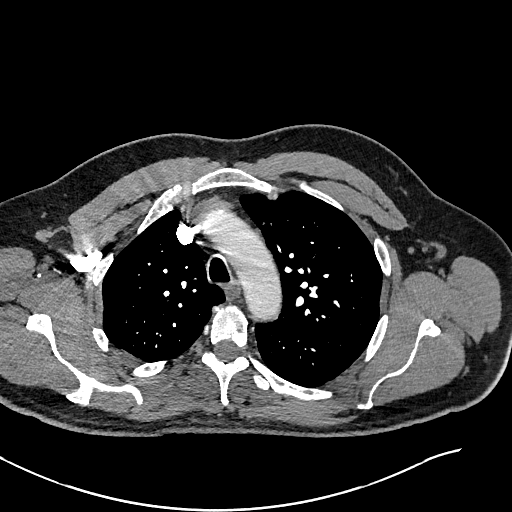
[im 412/481  lung]
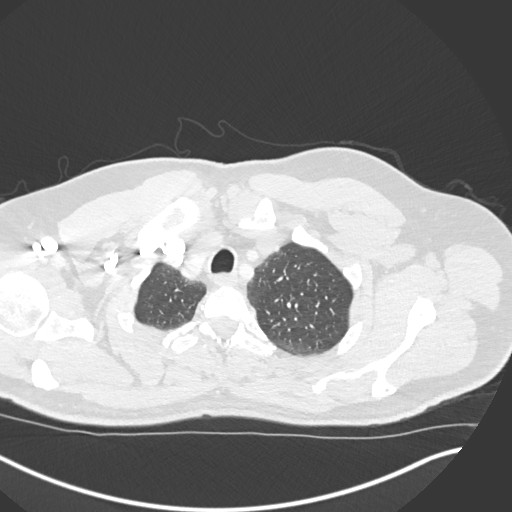
[im 435/481  mediastinal]
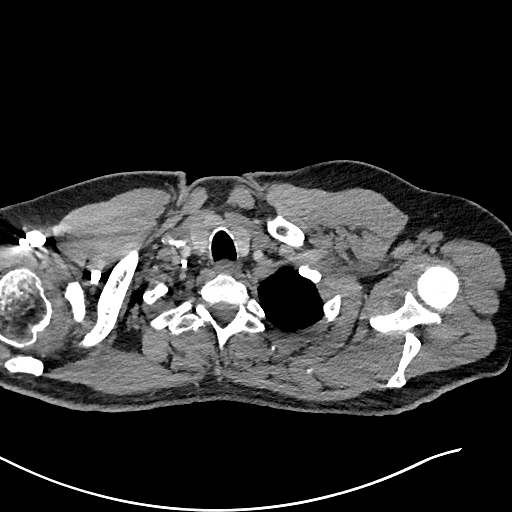
[im 458/481  lung]
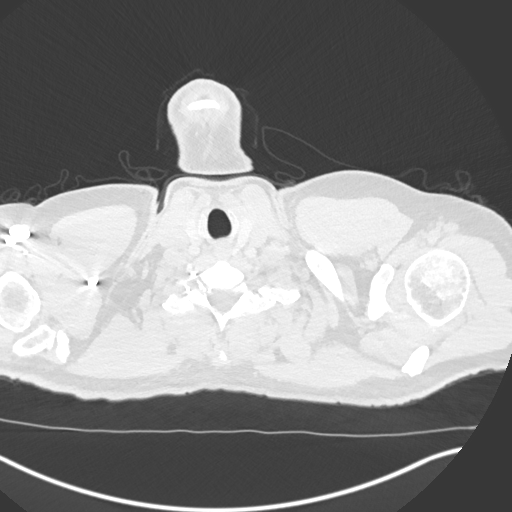

[Series 7: cor soft · coronal · 0.82mm/px · 1 of 165 slices shown]
[im 83/165  mediastinal]
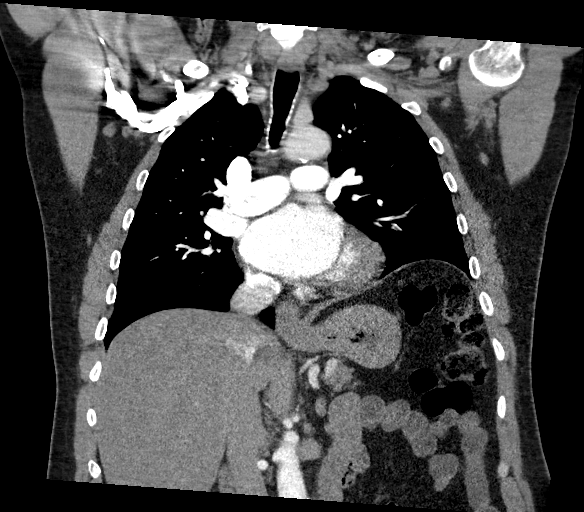

[18 of 36 positions shown; findings below may reference images not displayed]

FINDINGS: Cardiovascular: Satisfactory opacification of the pulmonary arteries
to the segmental level. No evidence of pulmonary embolism. Prominent
heart size. No pericardial effusion. The thoracic aorta is normal in
caliber. Mild atherosclerotic plaque. Mild aortic valve leaflet
calcifications. No definite coronary artery calcification.

Mediastinum/Nodes: No enlarged mediastinal, hilar, or axillary lymph
nodes. Thyroid gland, trachea, and esophagus demonstrate no
significant findings. Elevated left hemidiaphragm.

Lungs/Pleura: Bibasilar atelectasis. No focal consolidation. The
triangular subpleural right pulmonary micronodule likely represents
an intrapulmonary lymph node ([DATE]). No pulmonary mass. No pleural
effusion. No pneumothorax.

Upper Abdomen: Status post cholecystectomy.  No acute abnormality.

Musculoskeletal:

No chest wall abnormality.

No suspicious lytic or blastic osseous lesions. No acute displaced
fracture. Multilevel degenerative changes of the spine.

Review of the MIP images confirms the above findings.
IMPRESSION: 1. No pulmonary embolus.
2. No acute intrathoracic abnormality.
3. Aortic Atherosclerosis ([id number]-[id number].[id number]) including aortic valve
leaflet indication-correlate with aortic stenosis.

## 2022-11-23 IMAGING — DX DG CHEST 2V
3 series · 3 of 3 positions shown · non-contrast
Comparison: 09/04/2013

CLINICAL DATA: Coughing up blood

EXAM:
CHEST - 2 VIEW

[chest pa (1 of 2)]
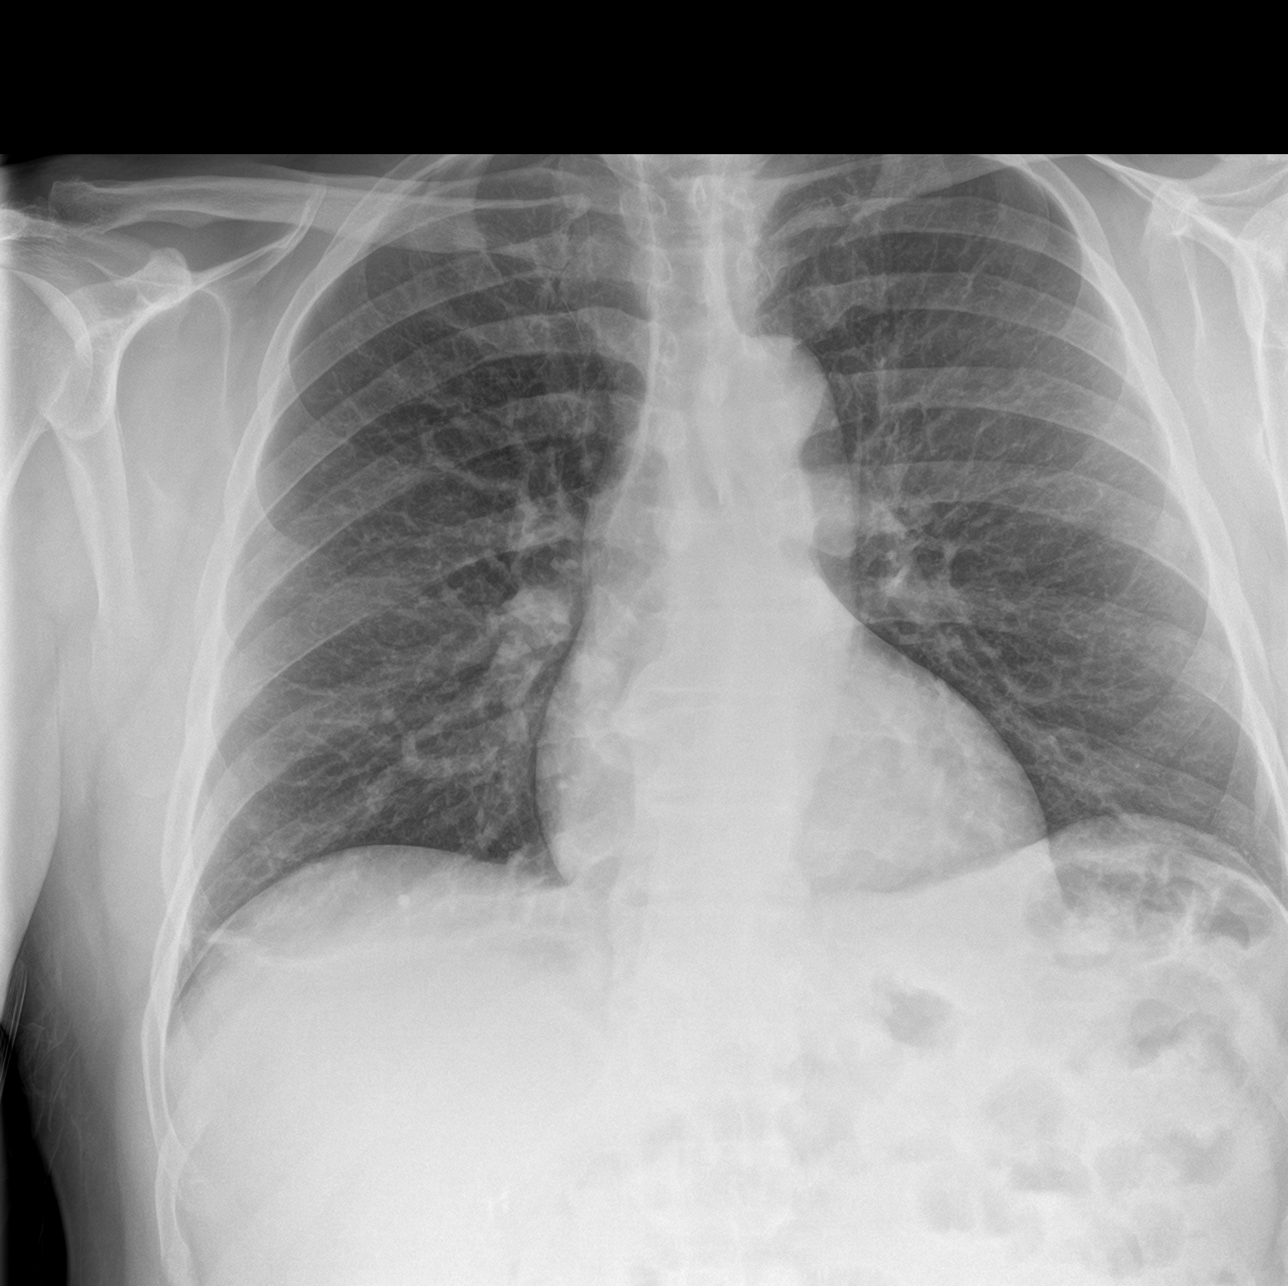

[chest lat]
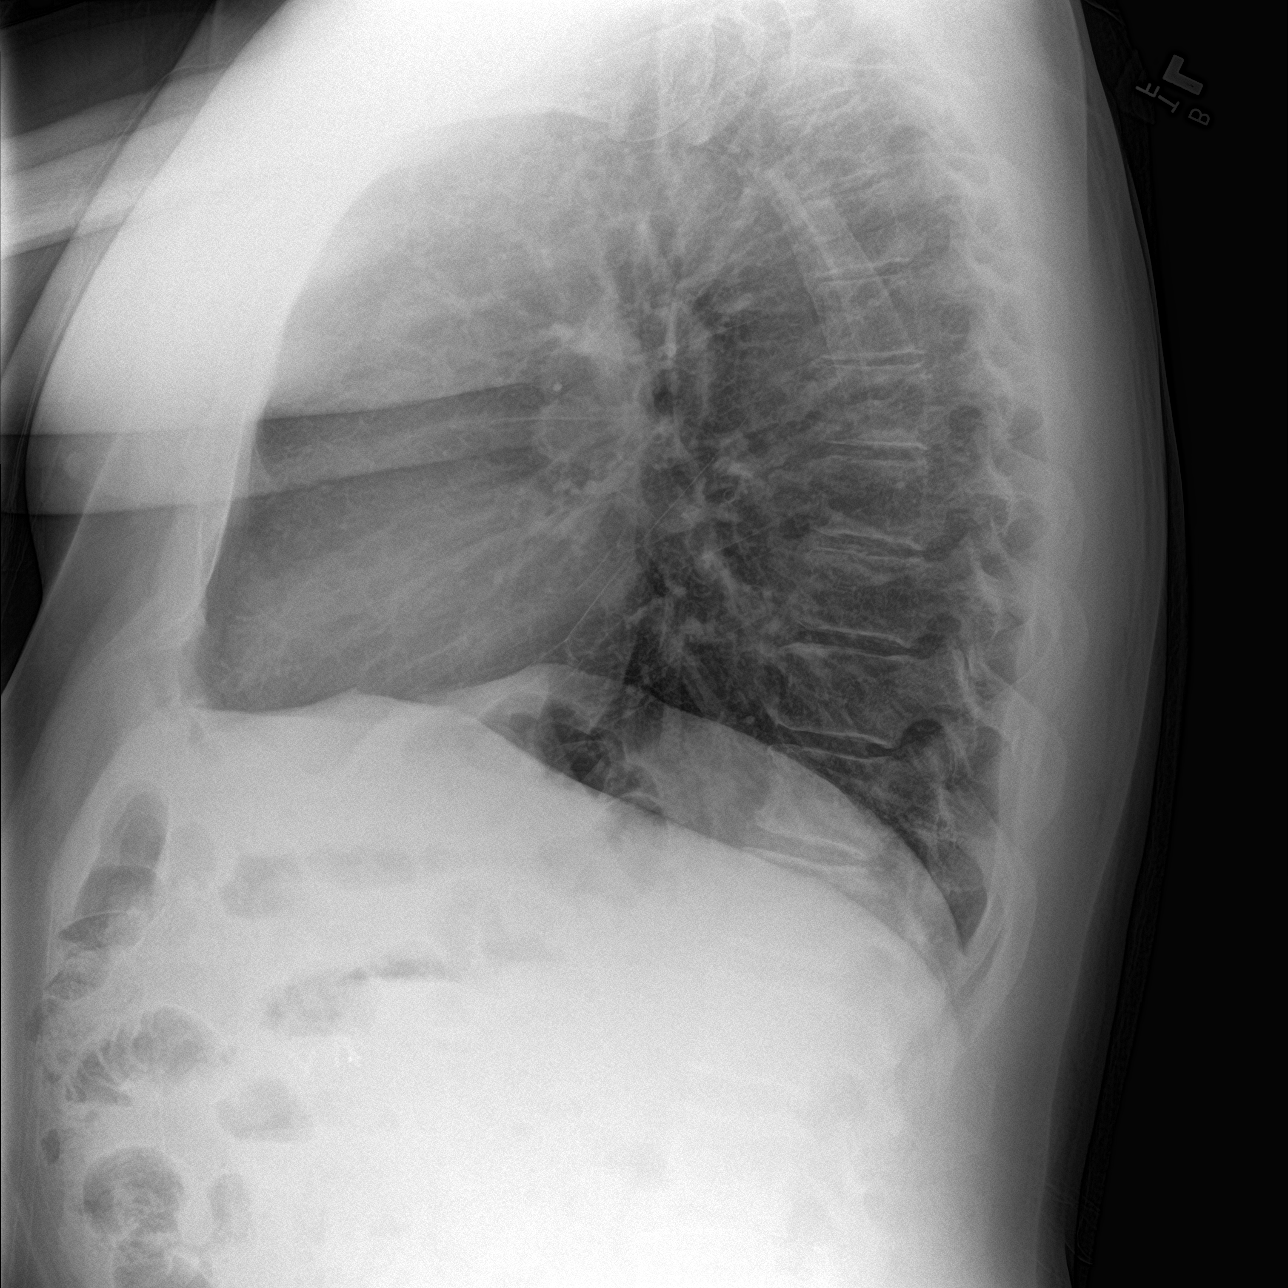

[chest pa (2 of 2)]
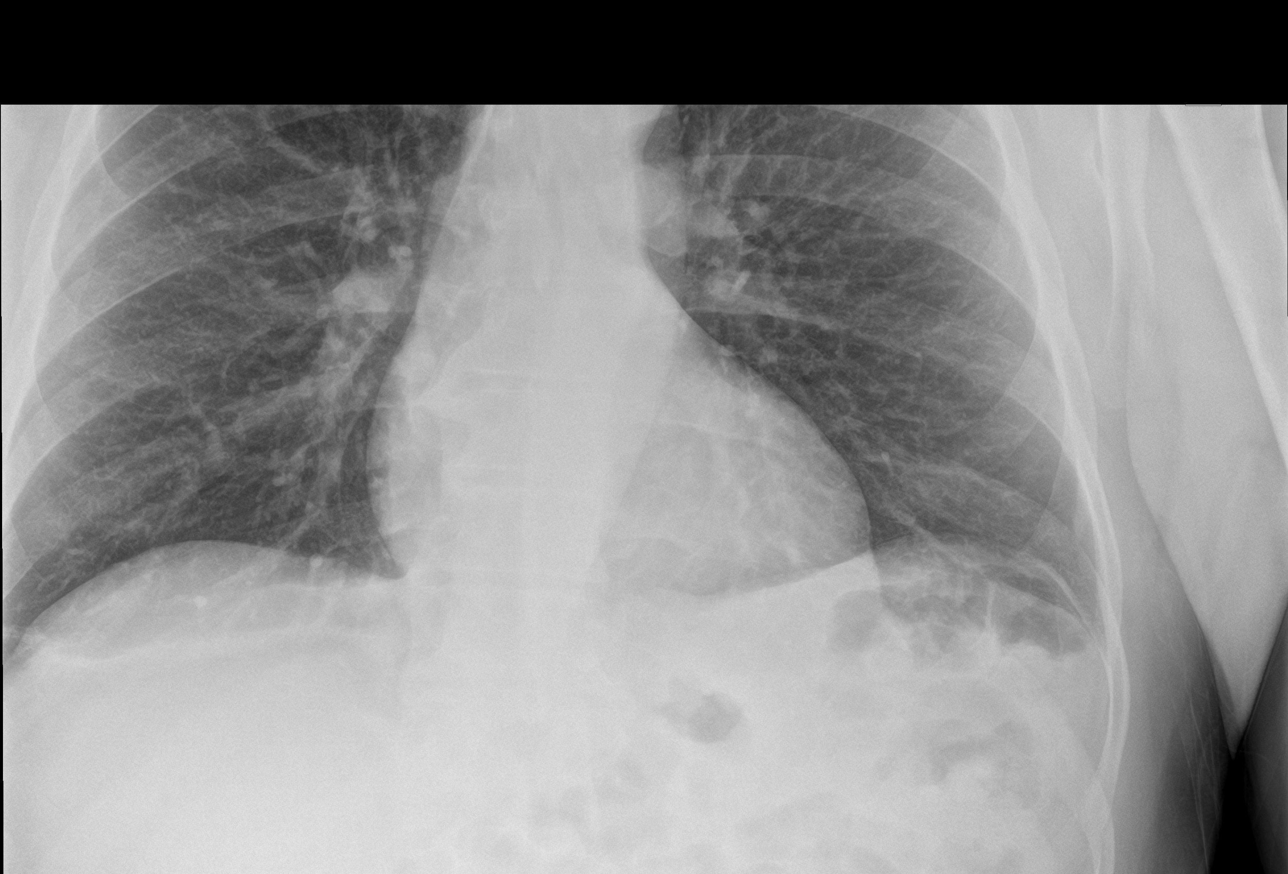

[3 of 3 positions shown; findings below may reference images not displayed]

FINDINGS: The heart size and mediastinal contours are within normal limits.
Both lungs are clear. The visualized skeletal structures are
unremarkable.
IMPRESSION: No active cardiopulmonary disease.
